# Patient Record
Sex: Female | Born: 1965
Health system: Southern US, Community
[De-identification: ages and names within clinical notes are randomized; demographics above are authoritative.]

## PROBLEM LIST (undated history)

## (undated) DIAGNOSIS — J302 Other seasonal allergic rhinitis: Secondary | ICD-10-CM

## (undated) DIAGNOSIS — D649 Anemia, unspecified: Secondary | ICD-10-CM

## (undated) DIAGNOSIS — A64 Unspecified sexually transmitted disease: Secondary | ICD-10-CM

## (undated) DIAGNOSIS — I10 Essential (primary) hypertension: Secondary | ICD-10-CM

## (undated) HISTORY — DX: Anemia, unspecified: D64.9

## (undated) HISTORY — DX: Unspecified sexually transmitted disease: A64

## (undated) HISTORY — DX: Other seasonal allergic rhinitis: J30.2

## (undated) HISTORY — DX: Essential (primary) hypertension: I10

## (undated) HISTORY — PX: TUBAL LIGATION: SHX77

---

## 2002-03-12 ENCOUNTER — Other Ambulatory Visit: Admission: RE | Admit: 2002-03-12 | Discharge: 2002-03-12 | Payer: Self-pay | Admitting: *Deleted

## 2003-11-30 ENCOUNTER — Other Ambulatory Visit: Admission: RE | Admit: 2003-11-30 | Discharge: 2003-11-30 | Payer: Self-pay | Admitting: Obstetrics and Gynecology

## 2004-07-20 ENCOUNTER — Encounter (INDEPENDENT_AMBULATORY_CARE_PROVIDER_SITE_OTHER): Payer: Self-pay | Admitting: Specialist

## 2004-07-20 ENCOUNTER — Inpatient Hospital Stay (HOSPITAL_COMMUNITY): Admission: AD | Admit: 2004-07-20 | Discharge: 2004-07-23 | Payer: Self-pay | Admitting: Obstetrics & Gynecology

## 2004-08-31 ENCOUNTER — Other Ambulatory Visit: Admission: RE | Admit: 2004-08-31 | Discharge: 2004-08-31 | Payer: Self-pay | Admitting: Gynecology

## 2006-01-29 ENCOUNTER — Other Ambulatory Visit: Admission: RE | Admit: 2006-01-29 | Discharge: 2006-01-29 | Payer: Self-pay | Admitting: Gynecology

## 2008-10-08 ENCOUNTER — Other Ambulatory Visit: Admission: RE | Admit: 2008-10-08 | Discharge: 2008-10-08 | Payer: Self-pay | Admitting: Gynecology

## 2008-10-08 ENCOUNTER — Encounter: Payer: Self-pay | Admitting: Gynecology

## 2008-10-08 ENCOUNTER — Ambulatory Visit: Payer: Self-pay | Admitting: Gynecology

## 2008-10-30 ENCOUNTER — Ambulatory Visit: Payer: Self-pay | Admitting: Gynecology

## 2009-06-03 ENCOUNTER — Ambulatory Visit (HOSPITAL_COMMUNITY): Admission: RE | Admit: 2009-06-03 | Discharge: 2009-06-03 | Payer: Self-pay | Admitting: Gynecology

## 2009-12-28 ENCOUNTER — Other Ambulatory Visit: Admission: RE | Admit: 2009-12-28 | Discharge: 2009-12-28 | Payer: Self-pay | Admitting: Gynecology

## 2009-12-28 ENCOUNTER — Ambulatory Visit: Payer: Self-pay | Admitting: Gynecology

## 2010-10-24 ENCOUNTER — Ambulatory Visit (HOSPITAL_COMMUNITY)
Admission: RE | Admit: 2010-10-24 | Discharge: 2010-10-24 | Payer: Self-pay | Source: Home / Self Care | Attending: Gynecology | Admitting: Gynecology

## 2011-02-24 NOTE — Discharge Summary (Signed)
NAME:  Jessica Henry, Jessica Henry                   ACCOUNT NO.:  [id number]   MEDICAL RECORD NO.:  [id number]          PATIENT TYPE:  INP   LOCATION:  9138                          FACILITY:  [facility]   PHYSICIAN:  Juan H. Lily Peer, M.D.DATE OF BIRTH:  01-05-1966   DATE OF ADMISSION:  07/20/2004  DATE OF DISCHARGE:  07/23/2004                                 DISCHARGE SUMMARY   Total days hospitalized:  3.   HISTORY:  The patient is a 45 year old gravida 3 para 2 at 40-and-a-half  weeks estimated gestational age who was admitted for induction as a result  of postdates.  The patient with pregnancy-induced hypertension.  Eventually  was taken to the operating room for a primary cesarean section secondary to  nonreassuring fetal heart rate tracing, prolonged rupture of membranes.  She  had had meconium-stained amniotic fluid during her induction process and had  received magnesium sulfate for seizure prophylaxis and was taken for a  primary lower uterine segment transverse cesarean section with request for  tubal sterilization, which was performed via the Pomeroy technique.  The  patient's PIH labs on admission had demonstrated a hemoglobin of 11.3;  hematocrit 34.7; platelet count of 241,000.  The remainder of PIH panel was  normal.  Postpartum day #1, hemoglobin and hematocrit 9.8 and 30.4  respectively; platelet count 213,000.  PIH panel once again normal  parameters for pregnancy.  The patient's Foley catheter was removed after 24  hours.  She was up ambulating, voiding well, tolerating clear liquids and  her diet was advanced, and she was ready to be discharged home on  postoperative day #3.  Her blood type was O negative and she had received  RhoGAM on July 21, 2004.  She was afebrile, voiding well, tolerating a  regular diet well, had positive bowel sounds.  Her incision site was intact,  her staples were removed and incision was steri-stripped, she was ready to  be discharged home.   FINAL  DIAGNOSES:  1.  Postdate pregnancy.  2.  Pregnancy-induced hypertension.  3.  Meconium-stained amniotic fluid.  4.  Nonreassuring fetal heart rate tracing.  5.  Prolonged rupture of membranes.  6.  Request for elective permanent sterilization.  7.  RhoGAM candidate.   PROCEDURES PERFORMED:  1.  Primary lower uterine segment transverse cesarean section.  2.  Bilateral tubal sterilization procedure, Pomeroy technique.  3.  RhoGAM administration.   FINAL DISPOSITION AND FOLLOW-UP:  The patient was discharged home on  postoperative day #3, was up ambulating, voiding well, tolerating a regular  diet well.  Her staples were removed, incision was steri-stripped.  She  was given a prescription for prenatal vitamins and iron supplementation to  take one of each daily, along with Tylox one to two tablets p.o. q.4-6h.  p.r.n. pain.  Discharge instructions were provided.  She was instructed to  return back to the office in 6 weeks at [facility] for  routine postpartum visit.     Juan   JHF/MEDQ  D:  08/05/2004  T:  08/05/2004  Job:  [id number]

## 2011-02-24 NOTE — Discharge Summary (Signed)
NAME:  Jessica Henry, Jessica Henry                   ACCOUNT NO.:  [id number]   MEDICAL RECORD NO.:  [id number]          PATIENT TYPE:  INP   LOCATION:  9138                          [facility]ACILITY:  [facility]   PHYSICIAN:  Juan H. Lily Peer, M.D.DATE OF BIRTH:  April 19, 1966   DATE OF ADMISSION:  07/20/2004  DATE OF DISCHARGE:  07/23/2004                                 DISCHARGE SUMMARY   HISTORY:  The patient is a 45 year old, gravida 3, para 2 now para 3 who was  admitted on October 12. The patient was scheduled to be admitted on that day  in the evening for planned induction and subsequently presented with  spontaneous rupture of membranes and labile PIH.  Blood pressure of 143/87,  154/96 on admission.  She had a history of positive group B strep and due to  her allergies, she was placed on clindamycin for prophylaxis.  She had also  had minimal fluid noted so amnioinfusion had been started. Blood pressures  were running in the 80-90 range, her PIH panel was normal, she was started  on mag sulfate for seizure prophylaxis. She eventually had an epidural.  Subsequently she reached 6-7 cm, 90% effaced, -1 station despite adequate  labor pattern.  Her blood pressure was still in the 90-100 range despite  having been on an epidural and lateral positioning.  She had been  approximately 13 hours since her spontaneous rupture of membranes and there  was evidence of decreased long-term beat to beat variability on the fetal  heart rate tracing and subsequently based on this and the nonreassuring  fetal heart rate tracing, she was taken for primary cesarean section and  also she had requested for elective permanent sterilization.  She underwent  a primary lower uterine segment transverse cesarean section and bilateral  tubal sterilization procedure.  On October 12, she delivered a viable female  infant, Apgar's of 7 & 9 with a weight of 7 pounds 13 ounces, arterial cord  pH was 7.19. There was questionable light meconium  so at the time of this C  section she had had DeLee suction of the nasopharyngeal area. She lost  approximately 1000 mL of blood on resection and received 2600 mL of lactated  Ringer's. She was kept on the AICU for 24 hours while she was kept on  magnesium sulfate. The patient's urine output was adequate. Her hemoglobin  and hematocrit postoperative day #1 was 9.8 and 30.4 respectively, platelet  count 213,000.  Her blood pressure systolic is 140, diastolic 80-90. After  24 hours, the magnesium sulfate was discontinued, she was transferred to the  ward, she continued to remain normotensive and asymptomatic. On October 15  __________ postoperative day one, due to the fact that she was O negative.  She had received RhoGAM on October 13, her incision was intact, positive  bowel sounds. __________ and she was ready to be discharged home.   FINAL DIAGNOSES:  1.  Term pregnancy delivered.  2.  Pregnancy induced hypertension.  3.  Request for elective permanent sterilization.   PROCEDURE:  1.  Primary lower uterine  segment transverse cesarean section.  2.  Bilateral tubal sterilization, Pomeroy technique.  3.  RhoGAM administration.   DISPOSITION:  The patient was discharged home on her third postoperative  day. Her staples were removed, her incision was steri-stripped. She was sent  home on prenatal vitamins and iron supplementation.  Her hemoglobin had been  9.8 postpartum.  She was also given a prescription for Tylox to take 1 p.o.  q.4-6 h. p.r.n. pain. Instructed to return to the office in six weeks for  postop visit.      JHF/MEDQ  D:  07/23/2004  T:  07/23/2004  Job:  [id number]

## 2011-02-24 NOTE — H&P (Signed)
NAME:  Jessica Henry, Jessica Henry                   ACCOUNT NO.:  [id number]   MEDICAL RECORD NO.:  [id number]          PATIENT TYPE:  INP   LOCATION:                                [facility]  [facility]   PHYSICIAN:  Timothy P. Fontaine, M.D.DATE OF BIRTH:  1966/04/16   DATE OF ADMISSION:  07/20/2004  DATE OF DISCHARGE:                                HISTORY & PHYSICAL   CHIEF COMPLAINT:  Pregnancy at term, pregnancy induced hypertension, history  of HSV, no recent outbreaks.  Desires permanent sterilization. Positive beta  strep carrier.   HISTORY OF PRESENT ILLNESS:  A 45 year old, G3, P2 female at [redacted] weeks  gestation, history of genital HSV, no recent outbreaks with elevated blood  pressure near term in the 140/90 range.  The patient's admitted for  induction. The patient also desires postpartum tubal sterilization.  For the  remainder of her history, see her [facility].   PHYSICAL EXAMINATION:  HEENT:  Normal.  LUNGS:  Clear.  CARDIAC:  Regular rate with no rubs, murmurs or gallops.  ABDOMEN:  Gravid vertex fetus, reactive NST.  PELVIC:  Cervix is closed, 50%, -2 station, no evidence of HSV outbreak.   ASSESSMENT:  A 45 year old, G3, P2 female at term. Pregnancy induced  hypertension in 140/90 range, history of HSV, no recent outbreaks for  induction with Cervidil in the p.m., Pitocin in the a.m. Plan discussed and  accepted by the patient. She is beta strep positive and will plan on  antibiotic prophylaxis.      TPF/MEDQ  D:  07/19/2004  T:  07/19/2004  Job:  [id number]

## 2011-02-24 NOTE — Op Note (Signed)
NAME:  Jessica Henry, Jessica Henry                   ACCOUNT NO.:  [id number]   MEDICAL RECORD NO.:  [id number]          PATIENT TYPE:  INP   LOCATION:  9162                          [facility]   PHYSICIAN:  Juan H. Lily Peer, M.D.DATE OF BIRTH:  1966-09-19   DATE OF PROCEDURE:  07/20/2004  DATE OF DISCHARGE:                                 OPERATIVE REPORT   PREOPERATIVE DIAGNOSES:  1.  Post-dates pregnancy.  2.  Pregnancy-induced hypertension.  3.  Nonreassuring fetal heart rate tracing.  4.  Prolonged rupture of membranes.  5.  Request for elective permanent sterilization.   POSTOPERATIVE DIAGNOSES:  1.  Post-dates pregnancy.  2.  Pregnancy-induced hypertension.  3.  Nonreassuring fetal heart rate tracing.  4.  Prolonged rupture of membranes.  5.  Request for elective permanent sterilization.   ANESTHESIA:  Epidural.   SURGEON:  Juan H. Lily Peer, M.D.   PROCEDURES PERFORMED:  1.  Primary lower uterine segment transverse cesarean section.  2.  Bilateral tubal sterilization procedure, Pomeroy technique.   FINDINGS:  A viable female infant, Apgars of 7 and 9, with a weight of 7  pounds 13 ounces, arterial cord pH of 7.19.   INDICATION FOR OPERATION:  A 45 year old gravida 3, para 2, at 40-1/2 weeks'  estimated gestational age with prolonged rupture of membranes, nonreassuring  fetal heart rate tracing, pregnancy-induced hypertension, request for  elective permanent sterilization.   DESCRIPTION OF OPERATION:  After the patient was adequately counseled, she  was taken to the operating room, where she was redosed on her epidural  catheter.  Fetal heart rate was appreciated before disconnecting the fetal  scalp electrode.  It was in the 140-150 range.  The abdomen was prepped and  draped in the usual sterile fashion.  A Foley catheter was already in place.  A Pfannenstiel skin incision was made 2 cm above the symphysis pubis.  The  incision was carried from the skin and subcutaneous  tissue down to the  rectus fascia, whereby a midline nick was made.  The fascia was incised in a  transverse fashion.  The midline raphe was entered.  The peritoneal cavity  was entered cautiously.  The bladder flap was established, and the lower  uterine segment was incised in a transverse fashion.  Clear amniotic fluid  was present.  The newborn's head was delivered.  With the use of the DeLee  suction, the nasopharyngeal area was suctioned with clear fluid.  The  newborn gave an immediate cry after delivery.  The cord was doubly clamped  and excised and passed off to the pediatricians who were in attendance, who  gave the above-mentioned parameters.  After cord blood was obtained, the  placenta was delivered from the intrauterine cavity.  The uterus was  exteriorized.  The intrauterine cavity was swept clear of remaining products  of conception.  The transverse lower uterine incision was closed with a  running locking stitch of 0 Vicryl suture.  The uterus was placed back into  the pelvic cavity and then the left proximal portion of the fallopian tube  was identified and placed under traction with a Babcock clamp, and a 2 cm  segment was suture ligated x2 with 3-0 Vicryl suture and a 2 cm stump was  excised and passed off the operative field for histological evaluation.  The  remaining segments were Bovie cauterized.  A similar procedure was carried  out on the contralateral side.  The pelvic cavity was once again irrigated  with normal saline solution.  After ascertaining adequate hemostasis,  closure was started.  The visceral peritoneum was not reapproximated, but  the rectus fascia was closed with a running stitch of 0 Vicryl suture, the  subcutaneous bleeders were Bovie cauterized, the skin was reapproximated  with skin clips, followed by placement of Xeroform gauze and 4 x 8 dressing.  The patient was transferred to the recovery room with stable vital signs.  Blood loss was 1000  mL.  IV fluid was 2600 mL of lactated Ringer's.  Urine  output was 325 mL.  The patient had received Clindamycin at approximately  1600 hours as a result of a positive GBS culture and will receive an  additional dose at 2200 hours.      JHF/MEDQ  D:  07/20/2004  T:  07/21/2004  Job:  [id number]

## 2011-10-24 ENCOUNTER — Encounter: Payer: Self-pay | Admitting: *Deleted

## 2011-10-24 NOTE — Progress Notes (Signed)
Patient ID: Jessica Henry, female   DOB: 1966-06-27, 46 y.o.   MRN: [id number] Pt called and left message wanting refill on valtrex, pt is overdue for annual since march 2011. Left message for pt to call.

## 2011-11-20 ENCOUNTER — Encounter: Payer: Self-pay | Admitting: Gynecology

## 2011-11-27 ENCOUNTER — Other Ambulatory Visit (HOSPITAL_COMMUNITY)
Admission: RE | Admit: 2011-11-27 | Discharge: 2011-11-27 | Disposition: A | Discharge status: Home / Self Care | Payer: BC Managed Care – PPO | Source: Ambulatory Visit | Attending: Gynecology | Admitting: Gynecology

## 2011-11-27 ENCOUNTER — Ambulatory Visit (INDEPENDENT_AMBULATORY_CARE_PROVIDER_SITE_OTHER): Payer: BC Managed Care – PPO | Admitting: Gynecology

## 2011-11-27 ENCOUNTER — Encounter: Payer: Self-pay | Admitting: Gynecology

## 2011-11-27 VITALS — BP 136/88 | Ht 63.25 in | Wt 213.0 lb

## 2011-11-27 DIAGNOSIS — R221 Localized swelling, mass and lump, neck: Secondary | ICD-10-CM

## 2011-11-27 DIAGNOSIS — R8290 Unspecified abnormal findings in urine: Secondary | ICD-10-CM

## 2011-11-27 DIAGNOSIS — Z01419 Encounter for gynecological examination (general) (routine) without abnormal findings: Secondary | ICD-10-CM

## 2011-11-27 DIAGNOSIS — Z13220 Encounter for screening for lipoid disorders: Secondary | ICD-10-CM

## 2011-11-27 DIAGNOSIS — R8281 Pyuria: Secondary | ICD-10-CM

## 2011-11-27 DIAGNOSIS — R82998 Other abnormal findings in urine: Secondary | ICD-10-CM

## 2011-11-27 DIAGNOSIS — Z131 Encounter for screening for diabetes mellitus: Secondary | ICD-10-CM

## 2011-11-27 DIAGNOSIS — J302 Other seasonal allergic rhinitis: Secondary | ICD-10-CM | POA: Insufficient documentation

## 2011-11-27 DIAGNOSIS — R220 Localized swelling, mass and lump, head: Secondary | ICD-10-CM

## 2011-11-27 LAB — CBC WITH DIFFERENTIAL/PLATELET
Eosinophils Relative: 3 % (ref 0–5)
HCT: 33.8 % — ABNORMAL LOW (ref 36.0–46.0)
Hemoglobin: 10.9 g/dL — ABNORMAL LOW (ref 12.0–15.0)
Lymphocytes Relative: 27 % (ref 12–46)
Lymphs Abs: 2.5 10*3/uL (ref 0.7–4.0)
MCV: 79.7 fL (ref 78.0–100.0)
Monocytes Absolute: 0.6 10*3/uL (ref 0.1–1.0)
Monocytes Relative: 6 % (ref 3–12)
Platelets: 334 10*3/uL (ref 150–400)
RBC: 4.24 MIL/uL (ref 3.87–5.11)
WBC: 9.3 10*3/uL (ref 4.0–10.5)

## 2011-11-27 LAB — URINALYSIS W MICROSCOPIC + REFLEX CULTURE
Casts: NONE SEEN
Glucose, UA: NEGATIVE mg/dL
Ketones, ur: NEGATIVE mg/dL
Nitrite: NEGATIVE
WBC, UA: NONE SEEN WBC/hpf (ref <3)
pH: 6.5 (ref 5.0–8.0)

## 2011-11-27 LAB — LIPID PANEL
HDL: 44 mg/dL (ref >39)
LDL Cholesterol: 89 mg/dL (ref 0–99)
Total CHOL/HDL Ratio: 3.4 Ratio
Triglycerides: 81 mg/dL (ref <150)

## 2011-11-27 MED ORDER — VALACYCLOVIR HCL 500 MG PO TABS: 500.0000 mg | ORAL_TABLET | Freq: Every day | ORAL | Status: DC

## 2011-11-27 NOTE — Progress Notes (Signed)
Jessica Henry 03-26-1966 [id number]        46 y.o.  for annual exam.  Doing well without complaints  Past medical history,surgical history, medications, allergies, family history and social history were all reviewed and documented in the EPIC chart. ROS:  Was performed and pertinent positives and negatives are included in the history.  Exam: Jessica Henry chaperone present Filed Vitals:   11/27/11 0920  BP: 136/88   General appearance  Normal Skin grossly normal Head/Neck with 3-4 cm left lipomatous mass lateral base of neck and juncture with shoulder, nontender mobile.  No cervical or supraclavicular adenopathy thyroid normal Lungs  clear Cardiac RR, without RMG Abdominal  soft, nontender, without masses, organomegaly or hernia Breasts  examined lying and sitting without masses, retractions, discharge or axillary adenopathy. Pelvic  Ext/BUS/vagina  normal   Cervix  normal  Pap done  Uterus  anteverted, normal size, shape and contour, midline and mobile nontender   Adnexa  Without masses or tenderness    Anus and perineum  normal   Rectovaginal  normal sphincter tone without palpated masses or tenderness.    Assessment/Plan:  46 y.o. female for annual exam.    1. Lipomatous feeling mass left neck. Patient notes been there for several weeks never noticed it before. Going to start with a study CT/MRI and then referred to surgery for evaluation. 2. Pap smear. Pap smear was done today. She has no history of abnormal Pap smears with numerous normal records in her chart. Assuming this Pap smear normal will plan every 3 year Pap smears per current screening guidelines. 3. Mammography. Patient due for mammogram now and I encouraged her to schedule this. SBE monthly reviewed. 4. Health maintenance.  Menses are regular and acceptable. Status post BTL. Will check baseline CBC glucose lipid profile and urinalysis. Assuming patient continues well from a gynecologic standpoint she'll follow up in a year,  sooner as needed. Patient does know the importance of follow up with the general surgeon as we arrange for her.    Jessica Lords MD, 10:22 AM 11/27/2011

## 2011-11-27 NOTE — Patient Instructions (Signed)
Office will contact you about scheduling the x-ray study for the neck mass and follow up appointment with a surgeon.

## 2011-11-28 LAB — URINE CULTURE: Colony Count: 5000

## 2011-11-28 NOTE — Progress Notes (Signed)
Addended by: Dara Lords on: 11/28/2011 12:52 PM   Modules accepted: Orders

## 2011-11-28 NOTE — Progress Notes (Signed)
Addended by: Venora Maples on: 11/28/2011 04:13 PM   Modules accepted: Orders

## 2011-11-30 ENCOUNTER — Telehealth: Payer: Self-pay | Admitting: *Deleted

## 2011-11-30 ENCOUNTER — Other Ambulatory Visit: Payer: Self-pay | Admitting: *Deleted

## 2011-11-30 DIAGNOSIS — R221 Localized swelling, mass and lump, neck: Secondary | ICD-10-CM

## 2011-11-30 NOTE — Telephone Encounter (Signed)
Lm for patient to call.  appt set for mri at [facility] on 12/02/11 @ 10am.

## 2011-11-30 NOTE — Telephone Encounter (Signed)
Message copied by Mckinley Jewel Gisele Pack L on Thu Nov 30, 2011 11:10 AM ------      Message from: Dara Lords      Created: Mon Nov 27, 2011 11:20 AM       Schedule neck/thorax MRI reference new onset lipomatous mass base of left neck I'm going to refer her to the general surgeons but I want to see the MRI report first.

## 2011-12-01 NOTE — Telephone Encounter (Signed)
Patient informed. 

## 2011-12-02 ENCOUNTER — Encounter (HOSPITAL_COMMUNITY): Payer: Self-pay | Admitting: Emergency Medicine

## 2011-12-02 ENCOUNTER — Emergency Department (HOSPITAL_COMMUNITY)
Admission: EM | Admit: 2011-12-02 | Discharge: 2011-12-02 | Disposition: A | Discharge status: Home / Self Care | Payer: BC Managed Care – PPO | Attending: Emergency Medicine | Admitting: Emergency Medicine

## 2011-12-02 ENCOUNTER — Ambulatory Visit (HOSPITAL_COMMUNITY)
Admission: RE | Admit: 2011-12-02 | Discharge: 2011-12-02 | Disposition: A | Discharge status: Home / Self Care | Payer: BC Managed Care – PPO | Source: Ambulatory Visit | Attending: Gynecology | Admitting: Gynecology

## 2011-12-02 DIAGNOSIS — R221 Localized swelling, mass and lump, neck: Secondary | ICD-10-CM

## 2011-12-02 DIAGNOSIS — I10 Essential (primary) hypertension: Secondary | ICD-10-CM | POA: Insufficient documentation

## 2011-12-02 DIAGNOSIS — J309 Allergic rhinitis, unspecified: Secondary | ICD-10-CM | POA: Insufficient documentation

## 2011-12-02 DIAGNOSIS — R220 Localized swelling, mass and lump, head: Secondary | ICD-10-CM | POA: Insufficient documentation

## 2011-12-02 DIAGNOSIS — E079 Disorder of thyroid, unspecified: Secondary | ICD-10-CM | POA: Insufficient documentation

## 2011-12-02 MED ORDER — MOMETASONE FUROATE 50 MCG/ACT NA SUSP: 2.0000 | Freq: Every day | NASAL | Status: DC

## 2011-12-02 MED ORDER — GADOBENATE DIMEGLUMINE 529 MG/ML IV SOLN
20.0000 mL | Freq: Once | INTRAVENOUS | Status: AC | PRN
  Administered 2011-12-02: 20 mL via INTRAVENOUS

## 2011-12-02 MED ORDER — DIPHENHYDRAMINE HCL 25 MG PO CAPS: 25.0000 mg | ORAL_CAPSULE | Freq: Four times a day (QID) | ORAL | Status: AC | PRN

## 2011-12-02 NOTE — ED Provider Notes (Signed)
History     CSN: [id number]  Arrival date & time 12/02/11  1254   First MD Initiated Contact with Patient 12/02/11 1323      Chief Complaint  Patient presents with  . Facial Swelling    (Consider location/radiation/quality/duration/timing/severity/associated sxs/prior treatment) Patient is a 46 y.o. female presenting with general illness. The history is provided by the patient. No language interpreter was used.  Illness  The current episode started today (Pt had been having an MRI to check on subcutaneous masses in her shoulders.  When she was almost finished she sneezed, and had profuse rhinorrhea.  When she was dressing sho noted mild swelling just below the right lower eyelid.  these was no itching.). The onset was sudden. The problem has been unchanged. The problem is mild. The symptoms are relieved by nothing. The symptoms are aggravated by nothing. Associated symptoms include rhinorrhea. Pertinent negatives include no fever, no cough, no wheezing and no rash.    Past Medical History  Diagnosis Date  . Seasonal allergies   . Hypertension     Past Surgical History  Procedure Date  . Cesarean section   . Tubal ligation     Family History  Problem Relation Age of Onset  . Diabetes Mother   . Hypertension Mother   . Heart disease Mother     History  Substance Use Topics  . Smoking status: Never Smoker   . Smokeless tobacco: Never Used  . Alcohol Use: No    OB History    Grav Para Term Preterm Abortions TAB SAB Ect Mult Living   3 3 2 1      3       Review of Systems  Constitutional: Negative.  Negative for fever and chills.  HENT: Positive for facial swelling and rhinorrhea.   Eyes: Negative.   Respiratory: Negative.  Negative for cough and wheezing.   Cardiovascular: Negative.   Gastrointestinal: Negative.   Genitourinary: Negative.   Musculoskeletal: Negative.   Skin: Negative for rash.  Neurological: Negative.   Psychiatric/Behavioral: Negative.      Allergies  Penicillins  Home Medications   Current Outpatient Rx  Name Route Sig Dispense Refill  . OMEGA-3 FATTY ACIDS 1000 MG PO CAPS Oral Take 1 g by mouth every morning.     Marland Kitchen LISINOPRIL 10 MG PO TABS Oral Take 10 mg by mouth every morning.     . MOMETASONE FUROATE 50 MCG/ACT NA SUSP Nasal Place 2 sprays into the nose every morning.     Marland Kitchen ONE-DAILY MULTI VITAMINS PO TABS Oral Take 1 tablet by mouth every morning.     Marland Kitchen NAPHAZOLINE-PHENIRAMINE 0.025-0.3 % OP SOLN Both Eyes Place 1 drop into both eyes 4 (four) times daily as needed. For eye irritation.    Marland Kitchen VALACYCLOVIR HCL 500 MG PO TABS Oral Take 500 mg by mouth daily as needed. For outbreaks.      BP 151/87  Pulse 86  Temp(Src) 98.8 F (37.1 C) (Oral)  Resp 18  Ht 5\' 4"  (1.626 m)  Wt 200 lb (90.719 kg)  BMI 34.33 kg/m2  SpO2 100%  LMP 11/09/2011  Physical Exam  Nursing note and vitals reviewed. Constitutional: She is oriented to person, place, and time. She appears well-developed and well-nourished. No distress.  HENT:  Head: Normocephalic and atraumatic.  Right Ear: External ear normal.  Left Ear: External ear normal.  Mouth/Throat: Oropharynx is clear and moist.       She has swelling of the nasal  mucosa and turbinates, with a clear rhinorrhea.  She has mild swelling below the right lower eyelid, but no redness or subcutaneous emphysema.    Eyes: Conjunctivae and EOM are normal. Pupils are equal, round, and reactive to light.  Cardiovascular: Normal rate, regular rhythm and normal heart sounds.   Pulmonary/Chest: Effort normal and breath sounds normal.  Abdominal: Soft. There is no tenderness.  Musculoskeletal: Normal range of motion.  Neurological: She is alert and oriented to person, place, and time.       No sensory or motor deficit.  Skin: Skin is warm and dry.  Psychiatric: She has a normal mood and affect. Her behavior is normal.    ED Course  Procedures (including critical care time)  1:53 PM Pt  seen --> physical exam performed.  She has swelling of her nasal mucosa from allergic rhinitis, and has mild puffiness just below her right lower eyelid, but no subcutaneous emphysema.  I refilled her prescription for Nasonex nasal inhaler, and advised her to use Benadryl 25 mg every 4 hours if she developed itching.     1. Facial swelling   2. Allergic rhinitis            Carleene Cooper III, MD 12/02/11 [phone]

## 2011-12-02 NOTE — Discharge Instructions (Signed)
Ms. Jessica Henry, you had mild swelling below the right lower eyelid after you had an MRI today and had sneezing and clear discharge from your nose. Your exam shows swelling of the nasal mucosa and clear mucus.  There is mild swelling below the right lower eyelid.  Dr. Ignacia Palma refilled your prescription for the Nasonex inhaler.  He advised you to rest with the head elevated and to try not to sneeze, hoping to let the swelling go down.  Take Benadryl 25 mg every 4 hours if you develop itching.  Return for recheck if your swelling worsens or if you develop a fever or if you have other worrisome symptoms.

## 2011-12-02 NOTE — ED Notes (Signed)
Pt was at outpatient MRI and at end of procedure pt felt a "tickle in my throat" with right facial swelling.

## 2011-12-06 ENCOUNTER — Telehealth: Payer: Self-pay | Admitting: *Deleted

## 2011-12-06 NOTE — Telephone Encounter (Signed)
Called pt and her mailbox is full, will try to call pt back later.

## 2011-12-06 NOTE — Telephone Encounter (Signed)
Message copied by Aura Camps on Wed Dec 06, 2011 11:47 AM ------      Message from: Dara Lords      Created: Wed Dec 06, 2011 11:44 AM       Get patient on the phone for me please

## 2011-12-08 ENCOUNTER — Telehealth: Payer: Self-pay | Admitting: *Deleted

## 2011-12-08 NOTE — Telephone Encounter (Signed)
Patient informed appt set up with Dr. Talmage Nap on 01/24/12 @1 :30pm

## 2011-12-15 NOTE — Telephone Encounter (Signed)
Pt mailbox is full

## 2013-07-02 ENCOUNTER — Telehealth: Payer: Self-pay | Admitting: *Deleted

## 2013-07-02 MED ORDER — VALACYCLOVIR HCL 500 MG PO TABS: 500.0000 mg | ORAL_TABLET | Freq: Every day | ORAL | Status: DC | PRN

## 2013-07-02 NOTE — Telephone Encounter (Signed)
Pt called requesting refill on valtrex 500 mg, pt has annual scheduled of Oct 24. rx sent.

## 2013-07-28 ENCOUNTER — Encounter: Payer: Self-pay | Admitting: Gynecology

## 2013-08-01 ENCOUNTER — Ambulatory Visit (INDEPENDENT_AMBULATORY_CARE_PROVIDER_SITE_OTHER): Payer: BC Managed Care – PPO | Admitting: Gynecology

## 2013-08-01 ENCOUNTER — Encounter: Payer: Self-pay | Admitting: Gynecology

## 2013-08-01 ENCOUNTER — Telehealth: Payer: Self-pay | Admitting: *Deleted

## 2013-08-01 VITALS — BP 124/80 | Ht 63.0 in | Wt 215.0 lb

## 2013-08-01 DIAGNOSIS — B009 Herpesviral infection, unspecified: Secondary | ICD-10-CM

## 2013-08-01 DIAGNOSIS — E041 Nontoxic single thyroid nodule: Secondary | ICD-10-CM

## 2013-08-01 DIAGNOSIS — N920 Excessive and frequent menstruation with regular cycle: Secondary | ICD-10-CM

## 2013-08-01 DIAGNOSIS — A609 Anogenital herpesviral infection, unspecified: Secondary | ICD-10-CM

## 2013-08-01 DIAGNOSIS — Z01419 Encounter for gynecological examination (general) (routine) without abnormal findings: Secondary | ICD-10-CM

## 2013-08-01 LAB — CBC WITH DIFFERENTIAL/PLATELET
Basophils Relative: 0 % (ref 0–1)
Eosinophils Absolute: 0.2 10*3/uL (ref 0.0–0.7)
HCT: 32.2 % — ABNORMAL LOW (ref 36.0–46.0)
Hemoglobin: 10.6 g/dL — ABNORMAL LOW (ref 12.0–15.0)
MCH: 25.6 pg — ABNORMAL LOW (ref 26.0–34.0)
MCHC: 32.9 g/dL (ref 30.0–36.0)
Monocytes Absolute: 0.8 10*3/uL (ref 0.1–1.0)
Monocytes Relative: 8 % (ref 3–12)

## 2013-08-01 LAB — COMPREHENSIVE METABOLIC PANEL
Albumin: 4.3 g/dL (ref 3.5–5.2)
Alkaline Phosphatase: 52 U/L (ref 39–117)
BUN: 12 mg/dL (ref 6–23)
Glucose, Bld: 97 mg/dL (ref 70–99)
Total Bilirubin: 0.3 mg/dL (ref 0.3–1.2)

## 2013-08-01 MED ORDER — VALACYCLOVIR HCL 500 MG PO TABS: 500.0000 mg | ORAL_TABLET | Freq: Every day | ORAL | Status: DC | PRN

## 2013-08-01 NOTE — Telephone Encounter (Signed)
Notes will be faxed to Dr.Balan they will contact me with time and date. I  Will fax once tsh level result is back.

## 2013-08-01 NOTE — Progress Notes (Signed)
Jessica Henry [birth year] [id number]        47 y.o.  [id number] for annual exam.  Several issues below.  Past medical history,surgical history, medications, allergies, family history and social history were all reviewed and documented in the EPIC chart.  ROS:  Performed and pertinent positives and negatives are included in the history, assessment and plan .  Exam: Kim assistant Filed Vitals:   08/01/13 1034  BP: 124/80  Height: 5\' 3"  (1.6 m)  Weight: 215 lb (97.523 kg)   General appearance  Normal Skin grossly normal Head/Neck With no cervical or supraclavicular adenopathy thyroid normal. Prominent left supra-clavicular fat pad Lungs  clear Cardiac RR, without RMG Abdominal  soft, nontender, without masses, organomegaly or hernia Breasts  examined lying and sitting without masses, retractions, discharge or axillary adenopathy. Pelvic  Ext/BUS/vagina  normal  Cervix  normal  Uterus  anteverted generous in size midline mobile nontender.  Adnexa  Without masses or tenderness    Anus and perineum  normal   Rectovaginal  normal sphincter tone without palpated masses or tenderness.    Assessment/Plan:  47 y.o. [id number] female for annual exam, heavy menses, tubal sterilization.   1. Menorrhagia. Patient's periods have become heavier such now that she wears double protection and has bleedthrough episodes. They are regular monthly. She is status post tubal sterilization. Exam shows uterus upper limits of normal size. Start with CBC TSH and sonohysterogram. Patient had questions about endometrial ablation and we will further discuss pending the above results.  2. Left thyroid lobe lesion. 9 x 13 x 17 mm well circumscribed on MRI last year done because of the prominent supraclavicular fat pad which did confirm that it was just a fat pad. Had appointment for further evaluation with Dr. Talmage Nap but she missed her appointment and never followed up. We will go ahead and make another appointment for her I  emphasized the need to followup for this to rule out tumor. 3. Genital HSV. Uses Valtrex daily. Has been doing this for years. Discussed options to stop and see what her recurrence rate is. Patient wants to go ahead and try this. If she does have a recurrence she'll start Valtrex 500 mg twice a day for 3-5 days. If she seems to have more frequent outbreaks she will reinitiate the daily suppressive therapy. I refilled her Valtrex for her. 4. Mammography 2012. Patient knows she's overdue and agrees to schedule. SBE monthly reviewed. 5. Pap smear 2013. No Pap smear done today. No history of abnormal Pap smears previously. Plan repeat Pap smear 3 year interval. 6. Health maintenance. Baseline CBC comprehensive metabolic panel urinalysis TSH ordered. Lipid profile normal last year. Followup for sonohysterogram and appointment with Dr. Talmage Nap.   Note: This document was prepared with digital dictation and possible smart phrase technology. Any transcriptional errors that result from this process are unintentional.   Dara Lords MD, 11:29 AM 08/01/2013

## 2013-08-01 NOTE — Telephone Encounter (Signed)
Message copied by Aura Camps on Fri Aug 01, 2013  4:12 PM ------      Message from: Dara Lords      Created: Fri Aug 01, 2013 11:33 AM       Patient needs appointment to see Dr Talmage Nap. She has a left thyroid nodule that needs evaluated. Was supposed to see her last year but missed her appointment. Information in the MRI report ------

## 2013-08-01 NOTE — Patient Instructions (Addendum)
Followup for ultrasound as scheduled.  Followup with Dr. Talmage Nap or her associate for further evaluation of your thyroid nodule.  Call to Schedule your mammogram  Facilities in [place]: 1)  [facility] of [place], [place] Phone: [phone] 2)  [facility], [address] [id number] Phone: [phone] 3)  Dr. Yolanda Bonine at [facility] [address] Phone: [phone]     Mammogram A mammogram is an X-ray test to find changes in a woman's breast. You should get a mammogram if:  You are 64 years of age or older  You have risk factors.   Your doctor recommends that you have one.  BEFORE THE TEST  Do not schedule the test the week before your period, especially if your breasts are sore during this time.  On the day of your mammogram:  Wash your breasts and armpits well. After washing, do not put on any deodorant or talcum powder on until after your test.   Eat and drink as you usually do.   Take your medicines as usual.   If you are diabetic and take insulin, make sure you:   Eat before coming for your test.   Take your insulin as usual.   If you cannot keep your appointment, call before the appointment to cancel. Schedule another appointment.  TEST  You will need to undress from the waist up. You will put on a hospital gown.   Your breast will be put on the mammogram machine, and it will press firmly on your breast with a piece of plastic called a compression paddle. This will make your breast flatter so that the machine can X-ray all parts of your breast.   Both breasts will be X-rayed. Each breast will be X-rayed from above and from the side. An X-ray might need to be taken again if the picture is not good enough.   The mammogram will last about 15 to 30 minutes.  AFTER THE TEST Finding out the results of your test Ask when your test results will be ready. Make sure you get  your test results.  Document Released: 12/22/2008 Document Revised: 09/14/2011 Document Reviewed: 12/22/2008 [facility] Patient Information 2012 [place], [place].

## 2013-08-01 NOTE — Telephone Encounter (Deleted)
Message copied by Aura Camps on Fri Aug 01, 2013  3:50 PM ------      Message from: Jessica Henry      Created: Fri Aug 01, 2013 11:33 AM       Patient needs appointment to see Dr Talmage Nap. She has a left thyroid nodule that needs evaluated. Was supposed to see her last year but missed her appointment. Information in the MRI report ------

## 2013-08-02 LAB — URINALYSIS W MICROSCOPIC + REFLEX CULTURE
Bilirubin Urine: NEGATIVE
Crystals: NONE SEEN
Glucose, UA: NEGATIVE mg/dL
Leukocytes, UA: NEGATIVE
Specific Gravity, Urine: 1.029 (ref 1.005–1.030)
Urobilinogen, UA: 0.2 mg/dL (ref 0.0–1.0)

## 2013-08-04 ENCOUNTER — Encounter: Payer: Self-pay | Admitting: Gynecology

## 2013-08-04 LAB — URINE CULTURE

## 2013-08-04 NOTE — Telephone Encounter (Signed)
Notes faxed to Dr.Balan office, they will contact me with time and date. 

## 2013-08-06 ENCOUNTER — Other Ambulatory Visit: Payer: Self-pay | Admitting: Gynecology

## 2013-08-06 DIAGNOSIS — N852 Hypertrophy of uterus: Secondary | ICD-10-CM

## 2013-08-06 DIAGNOSIS — N920 Excessive and frequent menstruation with regular cycle: Secondary | ICD-10-CM

## 2013-08-06 NOTE — Telephone Encounter (Signed)
appt 09/08/13 @ 10:30 am, pt can call to see if sooner appointment.

## 2013-08-06 NOTE — Telephone Encounter (Signed)
Left message for to call. 

## 2013-08-11 ENCOUNTER — Ambulatory Visit (INDEPENDENT_AMBULATORY_CARE_PROVIDER_SITE_OTHER): Payer: BC Managed Care – PPO

## 2013-08-11 ENCOUNTER — Other Ambulatory Visit: Payer: Self-pay | Admitting: Gynecology

## 2013-08-11 ENCOUNTER — Encounter: Payer: Self-pay | Admitting: Gynecology

## 2013-08-11 ENCOUNTER — Ambulatory Visit (INDEPENDENT_AMBULATORY_CARE_PROVIDER_SITE_OTHER): Payer: BC Managed Care – PPO | Admitting: Gynecology

## 2013-08-11 DIAGNOSIS — N920 Excessive and frequent menstruation with regular cycle: Secondary | ICD-10-CM

## 2013-08-11 DIAGNOSIS — N390 Urinary tract infection, site not specified: Secondary | ICD-10-CM

## 2013-08-11 DIAGNOSIS — D251 Intramural leiomyoma of uterus: Secondary | ICD-10-CM

## 2013-08-11 DIAGNOSIS — D259 Leiomyoma of uterus, unspecified: Secondary | ICD-10-CM

## 2013-08-11 DIAGNOSIS — N8300 Follicular cyst of ovary, unspecified side: Secondary | ICD-10-CM

## 2013-08-11 DIAGNOSIS — N852 Hypertrophy of uterus: Secondary | ICD-10-CM

## 2013-08-11 DIAGNOSIS — N831 Corpus luteum cyst of ovary, unspecified side: Secondary | ICD-10-CM

## 2013-08-11 DIAGNOSIS — N72 Inflammatory disease of cervix uteri: Secondary | ICD-10-CM

## 2013-08-11 DIAGNOSIS — N888 Other specified noninflammatory disorders of cervix uteri: Secondary | ICD-10-CM

## 2013-08-11 MED ORDER — SULFAMETHOXAZOLE-TRIMETHOPRIM 800-160 MG PO TABS: 1.0000 | ORAL_TABLET | Freq: Two times a day (BID) | ORAL | Status: DC

## 2013-08-11 NOTE — Progress Notes (Signed)
Patient presents for sonohysterogram due to menorrhagia.  Ultrasound shows uterus enlarged approaching 19 weeks. Endometrial echo 9.2 mm. Large 10 cm posterior myoma. Right and left ovaries with physiologic changes.  Reexamination shows uterus generous approaching 18 weeks size. Sonohysterogram performed, sterile technique, easy catheter introduction, good distention with no abnormalities. Endometrial sample taken.  Assessment and plan: Menorrhagia with large myoma. Under estimated size of uterus with prior exam but clearly she has an enlarged uterus at 18. Options for management reviewed with the patient to include hormonal manipulation, IUD, endometrial ablation, myomectomy either laparoscopic or open, uterine artery embolization and hysterectomy. Given the size of the uterus and circumstances I think hysterectomy would be her best choice. Options of laparoscopic/robotic versus TAH reviewed. Again given her short her status I think from a technical standpoint a TAH would be more reasonable. Referral to consider uterine artery embolization also discussed but rejected. The ovarian conservation issue also reviewed with keeping her ovaries for continued hormone production versus ovarian disease to include ovarian cancer in the future. Removing her ovaries and the potential for ERT and associated risks also discussed. Hemoglobin 10. She is on extra iron. Discussed possible Depo-Lupron preoperatively to allow for hemoglobin recovery and decreased risk of transfusion. Patient wants to think about her options and said she will call with her decision. She will followup for her pathology results from the endometrial sample also.  Patient had been sent a letter as herphone did not allow for messaging, about a UTI discovered at her recent annual exam and I prescribed Septra DS 1 by mouth twice a day x3 days today.

## 2013-08-11 NOTE — Patient Instructions (Signed)
Office will call you with biopsy results. You will think about the options we discussed and followup with your decision.

## 2013-09-01 ENCOUNTER — Encounter: Payer: Self-pay | Admitting: *Deleted

## 2013-09-01 NOTE — Telephone Encounter (Signed)
Letter mailed to patient regarding the below appointment.

## 2013-09-12 ENCOUNTER — Other Ambulatory Visit: Payer: Self-pay | Admitting: Gynecology

## 2013-09-12 MED ORDER — SULFAMETHOXAZOLE-TMP DS 800-160 MG PO TABS: 1.0000 | ORAL_TABLET | Freq: Two times a day (BID) | ORAL | Status: DC

## 2013-09-22 ENCOUNTER — Telehealth: Payer: Self-pay | Admitting: *Deleted

## 2013-09-22 NOTE — Telephone Encounter (Signed)
Pt missed 2 scheduled appointments with Dr.Balan 1. On 03/12/13 @ 1:30 pm and second one on 09/08/13 @ 10:30 am. Pt asked if she could be rescheduled with Dr. Talmage Nap I called office and was told Dr.Balan will not see patient that have missed 2 appointment which both were no show. I informed pt with this as well and asked pt would she like me to make another referral pt declined and said she will ask some coworker for some names and call back if needed.

## 2013-09-22 NOTE — Telephone Encounter (Signed)
Message copied by Aura Camps on Mon Sep 22, 2013 11:02 AM ------      Message from: Keenan Bachelor      Created: Fri Sep 12, 2013  4:01 PM      Regarding: Reschedule Dr. Talmage Nap appt       Missed appt on Monday with Dr. Talmage Nap.  You can call her at work number. ------

## 2013-10-15 ENCOUNTER — Other Ambulatory Visit: Payer: Self-pay | Admitting: Gynecology

## 2013-10-15 DIAGNOSIS — Z1231 Encounter for screening mammogram for malignant neoplasm of breast: Secondary | ICD-10-CM

## 2013-10-21 ENCOUNTER — Ambulatory Visit (HOSPITAL_COMMUNITY)
Admission: RE | Admit: 2013-10-21 | Discharge: 2013-10-21 | Disposition: A | Discharge status: Home / Self Care | Payer: BC Managed Care – PPO | Source: Ambulatory Visit | Attending: Gynecology | Admitting: Gynecology

## 2013-10-21 DIAGNOSIS — Z1231 Encounter for screening mammogram for malignant neoplasm of breast: Secondary | ICD-10-CM | POA: Insufficient documentation

## 2013-10-23 ENCOUNTER — Other Ambulatory Visit: Payer: Self-pay | Admitting: Gynecology

## 2013-10-23 DIAGNOSIS — R928 Other abnormal and inconclusive findings on diagnostic imaging of breast: Secondary | ICD-10-CM

## 2013-11-03 ENCOUNTER — Ambulatory Visit
Admission: RE | Admit: 2013-11-03 | Discharge: 2013-11-03 | Disposition: A | Discharge status: Home / Self Care | Payer: BC Managed Care – PPO | Source: Ambulatory Visit | Attending: Gynecology | Admitting: Gynecology

## 2013-11-03 DIAGNOSIS — R928 Other abnormal and inconclusive findings on diagnostic imaging of breast: Secondary | ICD-10-CM

## 2013-11-13 ENCOUNTER — Ambulatory Visit (INDEPENDENT_AMBULATORY_CARE_PROVIDER_SITE_OTHER): Payer: BC Managed Care – PPO | Admitting: Gynecology

## 2013-11-13 ENCOUNTER — Telehealth: Payer: Self-pay | Admitting: *Deleted

## 2013-11-13 ENCOUNTER — Encounter: Payer: Self-pay | Admitting: Gynecology

## 2013-11-13 DIAGNOSIS — N920 Excessive and frequent menstruation with regular cycle: Secondary | ICD-10-CM

## 2013-11-13 DIAGNOSIS — D251 Intramural leiomyoma of uterus: Secondary | ICD-10-CM

## 2013-11-13 DIAGNOSIS — E039 Hypothyroidism, unspecified: Secondary | ICD-10-CM

## 2013-11-13 LAB — CBC WITH DIFFERENTIAL/PLATELET
BASOS PCT: 0 % (ref 0–1)
Basophils Absolute: 0 10*3/uL (ref 0.0–0.1)
Eosinophils Absolute: 0.2 10*3/uL (ref 0.0–0.7)
Eosinophils Relative: 2 % (ref 0–5)
HEMATOCRIT: 32.8 % — AB (ref 36.0–46.0)
Hemoglobin: 10.3 g/dL — ABNORMAL LOW (ref 12.0–15.0)
LYMPHS ABS: 2.5 10*3/uL (ref 0.7–4.0)
Lymphocytes Relative: 23 % (ref 12–46)
MCH: 25.1 pg — ABNORMAL LOW (ref 26.0–34.0)
MCHC: 31.4 g/dL (ref 30.0–36.0)
MCV: 80 fL (ref 78.0–100.0)
MONO ABS: 0.8 10*3/uL (ref 0.1–1.0)
Monocytes Relative: 7 % (ref 3–12)
NEUTROS ABS: 7.1 10*3/uL (ref 1.7–7.7)
Neutrophils Relative %: 68 % (ref 43–77)
Platelets: 365 10*3/uL (ref 150–400)
RBC: 4.1 MIL/uL (ref 3.87–5.11)
RDW: 15.8 % — ABNORMAL HIGH (ref 11.5–15.5)
WBC: 10.6 10*3/uL — ABNORMAL HIGH (ref 4.0–10.5)

## 2013-11-13 NOTE — Progress Notes (Signed)
The patient presents in followup from her evaluation 08/11/2013 for her menorrhagia/leiomyomata. She's decided she is ready to proceed with hysterectomy. Continues to have heavy clot-like menses. No intermenstrual bleeding. Prior ultrasound showed large uterus with multiple myomas. Endometrial biopsy benign.  Exam was Irven Shelling Abdomen with palpable pelvic mass to the level of the umbilicus. Firm nontender. Pelvic bimanual with bulky mass filling the pelvis consistent with leiomyoma.  Assessment and plan: Menorrhagia, history of iron deficiency anemia now with some intermittent lower abdominal coming and going discomfort and wants to proceed with hysterectomy. I again reviewed options to include observation, hormonal manipulation, attempted Depo-Lupron, Mirena IUD, uterine artery embolization, endometrial ablation, myomectomy, hysterectomy. The pros/cons, risks/benefits of each choice discussed. Ultimately the patient wants to proceed with hysterectomy. Given the bulk of the uterus to the level of the umbilicus and the short stature I feel a TAH would be the most appropriate approach and do not feel laparoscopic/robotic would be realistic. The ovarian conservation issue was reviewed in the options to keep both ovaries for continued hormone production accepting the risks of ovarian disease in the future to include ovarian cancer versus removing both ovaries and accepting the risk of hormone replacement therapy or symptoms of lack of estrogen reviewed. She is no strong family history of ovarian cancer and prefers to keep both ovaries excepting the risks of ovarian disease to include ovarian cancer in the future. I reviewed the expected intraoperative and postoperative courses as well as the recovery period and the generalized risks to include infection, damage to internal organs, hemorrhage necessitating transfusion the risks of transfusion. Patient wants to move toward scheduling an will represent for a full  preoperative consult. Will check CBC today if significantly low discussed possible menstrual suppression with Depo-Lupron for hemoglobin recovery.

## 2013-11-13 NOTE — Telephone Encounter (Signed)
Referral placed in epic for Dr.Gherghe, they will contact pt to schedule.

## 2013-11-13 NOTE — Patient Instructions (Signed)
Office will call you to arrange surgery. 

## 2013-11-13 NOTE — Telephone Encounter (Signed)
Message copied by Thamas Jaegers on Thu Nov 13, 2013  3:47 PM ------      Message from: Anastasio Auerbach      Created: Thu Nov 13, 2013 12:38 PM       Patient apparently missed her appointment with Dr. Suzette Battiest reference to her hypothyroidism and date we'll not schedule her another appointment. Can we see if we can get her an appointment with another endocrinologist in town. ------

## 2013-11-14 ENCOUNTER — Encounter: Payer: Self-pay | Admitting: *Deleted

## 2013-11-17 ENCOUNTER — Telehealth: Payer: Self-pay

## 2013-11-17 NOTE — Telephone Encounter (Signed)
I called patient to talk with her about scheduling TAH. We discussed her ins benefits and estimated financial responsibility.  Dr. Loetta Rough asked me to discuss with her that she could do Depo Lupron 11.25 mg if she wanted some time to increase her iron stores and lower chance of transfusion at surgery.  I explained that her surgery would be 8-12 weeks after the injection.  Patient wants time to consider all of this and will call me back.

## 2013-12-09 NOTE — Telephone Encounter (Signed)
I called patient and asked her to please call [place] office to schedule appointment

## 2013-12-11 NOTE — Telephone Encounter (Signed)
Appointment 12/19/13.

## 2013-12-19 ENCOUNTER — Encounter: Payer: Self-pay | Admitting: Internal Medicine

## 2013-12-19 ENCOUNTER — Ambulatory Visit (INDEPENDENT_AMBULATORY_CARE_PROVIDER_SITE_OTHER): Payer: BC Managed Care – PPO | Admitting: Internal Medicine

## 2013-12-19 VITALS — BP 120/64 | HR 73 | Temp 98.2°F | Resp 12 | Ht 63.0 in | Wt 218.0 lb

## 2013-12-19 DIAGNOSIS — E039 Hypothyroidism, unspecified: Secondary | ICD-10-CM

## 2013-12-19 DIAGNOSIS — E041 Nontoxic single thyroid nodule: Secondary | ICD-10-CM

## 2013-12-19 LAB — TSH: TSH: 2.11 u[IU]/mL (ref 0.35–5.50)

## 2013-12-19 LAB — T3, FREE: T3 FREE: 2.6 pg/mL (ref 2.3–4.2)

## 2013-12-19 LAB — T4, FREE: FREE T4: 0.7 ng/dL (ref 0.60–1.60)

## 2013-12-19 NOTE — Progress Notes (Signed)
Patient ID: Jessica Henry, female   DOB: 11-15-1965, 48 y.o.   MRN: [id number]    HPI  Jessica Henry is a 48 y.o.-year-old female, referred by her ObGyn Dr. Dr Phineas Real, for evaluation for a Left thyroid nodule (actually the referral was for hypothyroidism, but, per review of the chart, TSH in 07/2013 was normal, so maybe error in referral process).  Thyroid MRI performed in 2013 to investigate lateral cervical lipid masses >> incidental 9 x 13 x 17 mm well circumscribed left thyroid lobe lesion. She was referred to have this investigated. She did not have a thyroid U/S.  Pt denies feeling nodules in neck, hoarseness, dysphagia/odynophagia, SOB with lying down.  I reviewed pt's thyroid tests: Lab Results  Component Value Date   TSH 2.540 08/01/2013    Pt c/o: - + fatigue Denies: - heat intolerance/cold intolerance - tremors - palpitations - anxiety/depression - hyperdefecation/constipation - weight loss - weight gain - dry skin - hair falling  Pt does not have a FH of thyroid ds. No FH of thyroid cancer. No h/o radiation tx to head or neck.  No seaweed or kelp, no recent contrast studies. No steroid use since 10/2013. No herbal supplements.   I reviewed her chart and she also has a history of HTN.  ROS: Constitutional: no weight gain/loss, + fatigue, no subjective hyperthermia/hypothermia Eyes: no blurry vision, no xerophthalmia ENT: no sore throat, no nodules palpated in throat, no dysphagia/odynophagia, no hoarseness Cardiovascular: no CP/SOB/palpitations/leg swelling Respiratory: no cough/SOB Gastrointestinal: no N/V/D/C Musculoskeletal: no muscle/joint aches Skin: no rashes Neurological: no tremors/numbness/tingling/dizziness Psychiatric: no depression/anxiety  Past Medical History  Diagnosis Date  . Seasonal allergies   . Hypertension   . STD (sexually transmitted disease)     HSV   Past Surgical History  Procedure Laterality Date  . Cesarean section    . Tubal  ligation     History   Social History  . Marital Status: Single    Spouse Name: N/A    Number of Children: 3   Occupational History  . Not on file.   Social History Main Topics  . Smoking status: Never Smoker   . Smokeless tobacco: Never Used  . Alcohol Use: No  . Drug Use: No  . Sexual Activity: Yes    Birth Control/ Protection: Surgical     Comment: Tubal lig   Current Outpatient Prescriptions on File Prior to Visit  Medication Sig Dispense Refill  . fish oil-omega-3 fatty acids 1000 MG capsule Take 1 g by mouth every morning.       Marland Kitchen lisinopril (PRINIVIL,ZESTRIL) 10 MG tablet Take 10 mg by mouth every morning.       . mometasone (NASONEX) 50 MCG/ACT nasal spray Place 2 sprays into the nose every morning.       . Multiple Vitamin (MULTIVITAMIN) tablet Take 1 tablet by mouth every morning.       . valACYclovir (VALTREX) 500 MG tablet Take 1 tablet (500 mg total) by mouth daily as needed. For outbreaks.  30 tablet  2   No current facility-administered medications on file prior to visit.   Allergies  Allergen Reactions  . Gadolinium Derivatives Swelling and Cough    Post contrast of IV Multihance pt began coughing, sneezing and had facial swelling, Rapid Response was needed and patient was taken to ED  . Penicillins Hives   Family History  Problem Relation Age of Onset  . Diabetes Mother   . Hypertension Mother   .  Heart disease Mother    PE: BP 120/64  Pulse 73  Temp(Src) 98.2 F (36.8 C) (Oral)  Resp 12  Ht 5\' 3"  (1.6 m)  Wt 218 lb (98.884 kg)  BMI 38.63 kg/m2  SpO2 97%  LMP 12/03/2013 Wt Readings from Last 3 Encounters:  12/19/13 218 lb (98.884 kg)  08/01/13 215 lb (97.523 kg)  12/02/11 200 lb (90.719 kg)   Constitutional: overweight, in NAD Eyes: PERRLA, EOMI, no exophthalmos ENT: moist mucous membranes, large neck, no thyromegaly, no thyroid nodule palpable, no cervical lymphadenopathy Cardiovascular: RRR, No MRG Respiratory: CTA B Gastrointestinal:  abdomen soft, NT, ND, BS+ Musculoskeletal: no deformities, strength intact in all 4;  Skin: moist, warm, no rashes Neurological: no tremor with outstretched hands, DTR normal in all 4  ASSESSMENT: 1. L thyroid nodule - incidentally seen on MRI 2013  PLAN: 1. L thyroid nodule  - I reviewed the images of her thyroid MRI along with the patient. The nodule is not very large, but MRI is not very accurate to describe thyroid nodules. Pt does not have a thyroid cancer family history or a personal history of RxTx to head/neck. All these would favor benignity. - the only way that we can tell exactly if it is cancer or not is by doing a thyroid U/S and, if indicated, a biopsy (FNA). I explained what the test entails. - I explained that this is not cancer, we can continue to follow her on a yearly basis, and check another ultrasound in another year or 2. - she should let me know if she develops neck compression symptoms, in that case, we might need to do either lobectomy or thyroidectomy - I'll see her back in a year. If FNA abnormal, we will meet sooner.  - will check TFTs today just in case... - I advised pt to join my chart and I will send her the results through there, but she declines  Office Visit on 12/19/2013  Component Date Value Ref Range Status  . TSH 12/19/2013 2.11  0.35 - 5.50 uIU/mL Final  . Free T4 12/19/2013 0.70  0.60 - 1.60 ng/dL Final  . T3, Free 12/19/2013 2.6  2.3 - 4.2 pg/mL Final  TFT normal, will go ahead with Thyroid U/S.  01/25/2014 U/S results still pending. I will addend the results when they become available.

## 2013-12-19 NOTE — Patient Instructions (Signed)
Please return in a year. Please stop at the lab. You will be called with the biopsy schedule.

## 2013-12-22 ENCOUNTER — Encounter: Payer: Self-pay | Admitting: *Deleted

## 2014-02-19 ENCOUNTER — Telehealth: Payer: Self-pay | Admitting: *Deleted

## 2014-02-19 NOTE — Telephone Encounter (Signed)
Dr Cruzita Lederer ordered this pt a thyroid U/S on 12/21/13. Can you tell me if you know if there was a problem with this or if it was done out of our system? Thanks for your help.

## 2014-03-09 ENCOUNTER — Telehealth: Payer: Self-pay | Admitting: *Deleted

## 2014-03-09 NOTE — Telephone Encounter (Signed)
Yes, please give her a call. Thanks so much!

## 2014-03-09 NOTE — Telephone Encounter (Signed)
[facility] has tried calling pt several times.Also has left message with husband with no return call

## 2014-03-09 NOTE — Telephone Encounter (Signed)
Jessica Henry sent message that [facility] has tried contacting pt several times. A message was left with pt's husband to return call and schedule thyroid U/S; still no response. Do I need to contact pt? Please advise.

## 2014-03-09 NOTE — Telephone Encounter (Signed)
Thank you for following up. I will contact pt and advise her to call them to schedule.

## 2014-03-09 NOTE — Telephone Encounter (Signed)
Tried to call pt and was unable to leave a message, mailbox was full. Will try again.

## 2014-03-10 ENCOUNTER — Encounter: Payer: Self-pay | Admitting: *Deleted

## 2014-04-23 ENCOUNTER — Telehealth: Payer: Self-pay | Admitting: *Deleted

## 2014-04-23 MED ORDER — VALACYCLOVIR HCL 500 MG PO TABS: 500.0000 mg | ORAL_TABLET | Freq: Every day | ORAL | Status: DC | PRN

## 2014-04-23 NOTE — Telephone Encounter (Signed)
Pt called requesting refill on Valtrex 500 mg tablet, rx sent, annual due in Oct. 2015

## 2014-08-10 ENCOUNTER — Encounter: Payer: Self-pay | Admitting: Internal Medicine

## 2014-11-25 ENCOUNTER — Other Ambulatory Visit: Payer: Self-pay | Admitting: Gynecology

## 2014-12-16 ENCOUNTER — Ambulatory Visit (INDEPENDENT_AMBULATORY_CARE_PROVIDER_SITE_OTHER): Payer: BLUE CROSS/BLUE SHIELD | Admitting: Gynecology

## 2014-12-16 ENCOUNTER — Other Ambulatory Visit (HOSPITAL_COMMUNITY)
Admission: RE | Admit: 2014-12-16 | Discharge: 2014-12-16 | Disposition: A | Discharge status: Home / Self Care | Payer: BLUE CROSS/BLUE SHIELD | Source: Ambulatory Visit | Attending: Gynecology | Admitting: Gynecology

## 2014-12-16 ENCOUNTER — Encounter: Payer: Self-pay | Admitting: Gynecology

## 2014-12-16 VITALS — BP 124/80 | Ht 64.0 in | Wt 224.0 lb

## 2014-12-16 DIAGNOSIS — Z01419 Encounter for gynecological examination (general) (routine) without abnormal findings: Secondary | ICD-10-CM | POA: Diagnosis not present

## 2014-12-16 DIAGNOSIS — L723 Sebaceous cyst: Secondary | ICD-10-CM | POA: Diagnosis not present

## 2014-12-16 DIAGNOSIS — A609 Anogenital herpesviral infection, unspecified: Secondary | ICD-10-CM | POA: Diagnosis not present

## 2014-12-16 DIAGNOSIS — D251 Intramural leiomyoma of uterus: Secondary | ICD-10-CM | POA: Diagnosis not present

## 2014-12-16 DIAGNOSIS — Z1151 Encounter for screening for human papillomavirus (HPV): Secondary | ICD-10-CM | POA: Insufficient documentation

## 2014-12-16 DIAGNOSIS — N920 Excessive and frequent menstruation with regular cycle: Secondary | ICD-10-CM

## 2014-12-16 LAB — CBC WITH DIFFERENTIAL/PLATELET
BASOS PCT: 0 % (ref 0–1)
Basophils Absolute: 0 10*3/uL (ref 0.0–0.1)
Eosinophils Absolute: 0.3 10*3/uL (ref 0.0–0.7)
Eosinophils Relative: 3 % (ref 0–5)
HCT: 32.5 % — ABNORMAL LOW (ref 36.0–46.0)
Hemoglobin: 10.1 g/dL — ABNORMAL LOW (ref 12.0–15.0)
LYMPHS PCT: 26 % (ref 12–46)
Lymphs Abs: 2.7 10*3/uL (ref 0.7–4.0)
MCH: 23.6 pg — ABNORMAL LOW (ref 26.0–34.0)
MCHC: 31.1 g/dL (ref 30.0–36.0)
MCV: 75.9 fL — ABNORMAL LOW (ref 78.0–100.0)
MONO ABS: 0.7 10*3/uL (ref 0.1–1.0)
MPV: 9.6 fL (ref 8.6–12.4)
Monocytes Relative: 7 % (ref 3–12)
NEUTROS ABS: 6.7 10*3/uL (ref 1.7–7.7)
Neutrophils Relative %: 64 % (ref 43–77)
Platelets: 394 10*3/uL (ref 150–400)
RBC: 4.28 MIL/uL (ref 3.87–5.11)
RDW: 15.4 % (ref 11.5–15.5)
WBC: 10.5 10*3/uL (ref 4.0–10.5)

## 2014-12-16 LAB — COMPREHENSIVE METABOLIC PANEL
ALBUMIN: 4.1 g/dL (ref 3.5–5.2)
ALT: 13 U/L (ref 0–35)
AST: 16 U/L (ref 0–37)
Alkaline Phosphatase: 54 U/L (ref 39–117)
BILIRUBIN TOTAL: 0.4 mg/dL (ref 0.2–1.2)
BUN: 11 mg/dL (ref 6–23)
CHLORIDE: 101 meq/L (ref 96–112)
CO2: 24 mEq/L (ref 19–32)
CREATININE: 0.7 mg/dL (ref 0.50–1.10)
Calcium: 10.2 mg/dL (ref 8.4–10.5)
Glucose, Bld: 77 mg/dL (ref 70–99)
Potassium: 3.9 mEq/L (ref 3.5–5.3)
SODIUM: 138 meq/L (ref 135–145)
TOTAL PROTEIN: 7.2 g/dL (ref 6.0–8.3)

## 2014-12-16 LAB — LIPID PANEL
CHOLESTEROL: 155 mg/dL (ref 0–200)
HDL: 40 mg/dL — ABNORMAL LOW (ref >=46)
LDL CALC: 94 mg/dL (ref 0–99)
TRIGLYCERIDES: 105 mg/dL (ref <150)
Total CHOL/HDL Ratio: 3.9 Ratio
VLDL: 21 mg/dL (ref 0–40)

## 2014-12-16 MED ORDER — VALACYCLOVIR HCL 500 MG PO TABS: 500.0000 mg | ORAL_TABLET | Freq: Two times a day (BID) | ORAL | Status: DC

## 2014-12-16 NOTE — Addendum Note (Signed)
Addended by: Nelva Nay on: 12/16/2014 02:53 PM   Modules accepted: Orders

## 2014-12-16 NOTE — Progress Notes (Signed)
Jessica Henry 02-29-1966 [id number]        49 y.o.  [id number] for annual exam.  Several issues noted below.  Past medical history,surgical history, problem list, medications, allergies, family history and social history were all reviewed and documented as reviewed in the EPIC chart.  ROS:  Performed with pertinent positives and negatives included in the history, assessment and plan.   Additional significant findings :  none   Exam: Kim Counsellor Vitals:   12/16/14 1404  BP: 124/80  Height: 5\' 4"  (1.626 m)  Weight: 224 lb (101.606 kg)   General appearance:  Normal affect, orientation and appearance. Skin: Grossly normal HEENT: Without gross lesions.  No cervical or supraclavicular adenopathy. Thyroid normal.  Lungs:  Clear without wheezing, rales or rhonchi Cardiac: RR, without RMG Abdominal:  Soft, nontender, without , guarding, rebound, organomegaly or hernia. Uterus to within fingerbreadth of umbilicus Breasts:  Examined lying and sitting without masses, retractions, discharge or axillary adenopathy.  1.5 cm classic sebaceous cyst mid sternum just lateral to her right breast. Firm, mobile with overlying blackhead. Pelvic:  Ext/BUS/vagina normal  Cervix normal. Pap smear/HPV done  Uterus bulky to within finger breath of umbilicus.   Adnexa  Unable to evaluate secondary to uterine bulk   Anus and perineum  Normal   Rectovaginal  Normal sphincter tone without palpated masses or tenderness.    Assessment/Plan:  49 y.o. [id number] female for annual exam regular heavy menses, tubal sterilization.   1. Menorrhagia/leiomyoma. Patient was to schedule hysterectomy last year but did not do so because of finances. Her menses continue heavy practically for the first 2 days. Uterus bulky to 18 weeks size. Patient wants to proceed with hysterectomy. Given the bulk of her uterus I feel TAH most prudent. Patient wants to move toward scheduling and we will make arrangements. Check baseline CBC  today. Possible menstrual suppression for hemoglobin recovery of flow. 2. History of HSV. Occasional outbreaks. Takes Valtrex 1-2 times daily for several days at earliest sign which seems to work. Valtrex No. 30 refill 2 provided 3. Pap smear 2013. Pap smear/HPV done today. No history of significant abnormal Pap smears previously. 4. Mammography due now.  Noticed a small nodule several months ago and when she called to schedule they recommended she see me first. Exam shows a classic small sebaceous cyst overlying the mid-sternum with overlying blackhead. Does not appear to be within the breast tissue itself. Recommended patient call back and schedule a regular mammogram. If she has any issues doing so still call me. She'll follow this area as it doesn't bother her and as long as remains stable will monitor. If enlarges or becomes uncomfortable she'll call for excisional referral. 5. Health maintenance. Baseline CBC comprehensive metabolic panel lipid profile urinalysis ordered. Follow up for scheduling surgery.     Anastasio Auerbach MD, 2:33 PM 12/16/2014

## 2014-12-16 NOTE — Patient Instructions (Signed)
Office will call you to discuss surgery.  You may obtain a copy of any labs that were done today by logging onto MyChart as outlined in the instructions provided with your AVS (after visit summary). The office will not call with normal lab results but certainly if there are any significant abnormalities then we will contact you.   Health Maintenance, Female A healthy lifestyle and preventative care can promote health and wellness.  Maintain regular health, dental, and eye exams.  Eat a healthy diet. Foods like vegetables, fruits, whole grains, low-fat dairy products, and lean protein foods contain the nutrients you need without too many calories. Decrease your intake of foods high in solid fats, added sugars, and salt. Get information about a proper diet from your caregiver, if necessary.  Regular physical exercise is one of the most important things you can do for your health. Most adults should get at least 150 minutes of moderate-intensity exercise (any activity that increases your heart rate and causes you to sweat) each week. In addition, most adults need muscle-strengthening exercises on 2 or more days a week.   Maintain a healthy weight. The body mass index (BMI) is a screening tool to identify possible weight problems. It provides an estimate of body fat based on height and weight. Your caregiver can help determine your BMI, and can help you achieve or maintain a healthy weight. For adults 20 years and older:  A BMI below 18.5 is considered underweight.  A BMI of 18.5 to 24.9 is normal.  A BMI of 25 to 29.9 is considered overweight.  A BMI of 30 and above is considered obese.  Maintain normal blood lipids and cholesterol by exercising and minimizing your intake of saturated fat. Eat a balanced diet with plenty of fruits and vegetables. Blood tests for lipids and cholesterol should begin at age 95 and be repeated every 5 years. If your lipid or cholesterol levels are high, you are over  50, or you are a high risk for heart disease, you may need your cholesterol levels checked more frequently.Ongoing high lipid and cholesterol levels should be treated with medicines if diet and exercise are not effective.  If you smoke, find out from your caregiver how to quit. If you do not use tobacco, do not start.  Lung cancer screening is recommended for adults aged 46 80 years who are at high risk for developing lung cancer because of a history of smoking. Yearly low-dose computed tomography (CT) is recommended for people who have at least a 30-pack-year history of smoking and are a current smoker or have quit within the past 15 years. A pack year of smoking is smoking an average of 1 pack of cigarettes a day for 1 year (for example: 1 pack a day for 30 years or 2 packs a day for 15 years). Yearly screening should continue until the smoker has stopped smoking for at least 15 years. Yearly screening should also be stopped for people who develop a health problem that would prevent them from having lung cancer treatment.  If you are pregnant, do not drink alcohol. If you are breastfeeding, be very cautious about drinking alcohol. If you are not pregnant and choose to drink alcohol, do not exceed 1 drink per day. One drink is considered to be 12 ounces (355 mL) of beer, 5 ounces (148 mL) of wine, or 1.5 ounces (44 mL) of liquor.  Avoid use of street drugs. Do not share needles with anyone. Ask for help  if you need support or instructions about stopping the use of drugs.  High blood pressure causes heart disease and increases the risk of stroke. Blood pressure should be checked at least every 1 to 2 years. Ongoing high blood pressure should be treated with medicines, if weight loss and exercise are not effective.  If you are 55 to 47 years old, ask your caregiver if you should take aspirin to prevent strokes.  Diabetes screening involves taking a blood sample to check your fasting blood sugar level.  This should be done once every 3 years, after age 45, if you are within normal weight and without risk factors for diabetes. Testing should be considered at a younger age or be carried out more frequently if you are overweight and have at least 1 risk factor for diabetes.  Breast cancer screening is essential preventative care for women. You should practice "breast self-awareness." This means understanding the normal appearance and feel of your breasts and may include breast self-examination. Any changes detected, no matter how small, should be reported to a caregiver. Women in their 20s and 30s should have a clinical breast exam (CBE) by a caregiver as part of a regular health exam every 1 to 3 years. After age 40, women should have a CBE every year. Starting at age 40, women should consider having a mammogram (breast X-ray) every year. Women who have a family history of breast cancer should talk to their caregiver about genetic screening. Women at a high risk of breast cancer should talk to their caregiver about having an MRI and a mammogram every year.  Breast cancer gene (BRCA)-related cancer risk assessment is recommended for women who have family members with BRCA-related cancers. BRCA-related cancers include breast, ovarian, tubal, and peritoneal cancers. Having family members with these cancers may be associated with an increased risk for harmful changes (mutations) in the breast cancer genes BRCA1 and BRCA2. Results of the assessment will determine the need for genetic counseling and BRCA1 and BRCA2 testing.  The Pap test is a screening test for cervical cancer. Women should have a Pap test starting at age 21. Between ages 21 and 29, Pap tests should be repeated every 2 years. Beginning at age 30, you should have a Pap test every 3 years as long as the past 3 Pap tests have been normal. If you had a hysterectomy for a problem that was not cancer or a condition that could lead to cancer, then you no  longer need Pap tests. If you are between ages 65 and 70, and you have had normal Pap tests going back 10 years, you no longer need Pap tests. If you have had past treatment for cervical cancer or a condition that could lead to cancer, you need Pap tests and screening for cancer for at least 20 years after your treatment. If Pap tests have been discontinued, risk factors (such as a new sexual partner) need to be reassessed to determine if screening should be resumed. Some women have medical problems that increase the chance of getting cervical cancer. In these cases, your caregiver may recommend more frequent screening and Pap tests.  The human papillomavirus (HPV) test is an additional test that may be used for cervical cancer screening. The HPV test looks for the virus that can cause the cell changes on the cervix. The cells collected during the Pap test can be tested for HPV. The HPV test could be used to screen women aged 30 years and older, and   should be used in women of any age who have unclear Pap test results. After the age of 30, women should have HPV testing at the same frequency as a Pap test.  Colorectal cancer can be detected and often prevented. Most routine colorectal cancer screening begins at the age of 50 and continues through age 75. However, your caregiver may recommend screening at an earlier age if you have risk factors for colon cancer. On a yearly basis, your caregiver may provide home test kits to check for hidden blood in the stool. Use of a small camera at the end of a tube, to directly examine the colon (sigmoidoscopy or colonoscopy), can detect the earliest forms of colorectal cancer. Talk to your caregiver about this at age 50, when routine screening begins. Direct examination of the colon should be repeated every 5 to 10 years through age 75, unless early forms of pre-cancerous polyps or small growths are found.  Hepatitis C blood testing is recommended for all people born from  1945 through 1965 and any individual with known risks for hepatitis C.  Practice safe sex. Use condoms and avoid high-risk sexual practices to reduce the spread of sexually transmitted infections (STIs). Sexually active women aged 25 and younger should be checked for Chlamydia, which is a common sexually transmitted infection. Older women with new or multiple partners should also be tested for Chlamydia. Testing for other STIs is recommended if you are sexually active and at increased risk.  Osteoporosis is a disease in which the bones lose minerals and strength with aging. This can result in serious bone fractures. The risk of osteoporosis can be identified using a bone density scan. Women ages 65 and over and women at risk for fractures or osteoporosis should discuss screening with their caregivers. Ask your caregiver whether you should be taking a calcium supplement or vitamin D to reduce the rate of osteoporosis.  Menopause can be associated with physical symptoms and risks. Hormone replacement therapy is available to decrease symptoms and risks. You should talk to your caregiver about whether hormone replacement therapy is right for you.  Use sunscreen. Apply sunscreen liberally and repeatedly throughout the day. You should seek shade when your shadow is shorter than you. Protect yourself by wearing long sleeves, pants, a wide-brimmed hat, and sunglasses year round, whenever you are outdoors.  Notify your caregiver of new moles or changes in moles, especially if there is a change in shape or color. Also notify your caregiver if a mole is larger than the size of a pencil eraser.  Stay current with your immunizations. Document Released: 04/10/2011 Document Revised: 01/20/2013 Document Reviewed: 04/10/2011 [employer] Patient Information 2014 [employer].   

## 2014-12-17 LAB — URINALYSIS W MICROSCOPIC + REFLEX CULTURE
Bilirubin Urine: NEGATIVE
Casts: NONE SEEN
Crystals: NONE SEEN
Glucose, UA: NEGATIVE mg/dL
Hgb urine dipstick: NEGATIVE
KETONES UR: NEGATIVE mg/dL
Leukocytes, UA: NEGATIVE
Nitrite: NEGATIVE
Protein, ur: NEGATIVE mg/dL
SQUAMOUS EPITHELIAL / LPF: NONE SEEN
Specific Gravity, Urine: 1.01 (ref 1.005–1.030)
UROBILINOGEN UA: 0.2 mg/dL (ref 0.0–1.0)
pH: 7 (ref 5.0–8.0)

## 2014-12-18 ENCOUNTER — Telehealth: Payer: Self-pay

## 2014-12-18 LAB — CYTOLOGY - PAP

## 2014-12-18 NOTE — Telephone Encounter (Signed)
I received the following staff message for: "Patient interested in proceeding with hysterectomy. Hemoglobin 10. Recommend Depo-Lupron 11.75 and surgery at the 2-3 month window. Supplement Iron daily. Tell patient due to lower hemoglobin she would be at increased risk of transfusion and we can help to minimize this with this protocol. "  I called patient and left message on cell phone vm asking her to call me.

## 2014-12-19 LAB — URINE CULTURE

## 2015-01-05 ENCOUNTER — Other Ambulatory Visit: Payer: Self-pay | Admitting: Gynecology

## 2015-01-05 MED ORDER — SULFAMETHOXAZOLE-TRIMETHOPRIM 800-160 MG PO TABS: 1.0000 | ORAL_TABLET | Freq: Two times a day (BID) | ORAL | Status: DC

## 2015-01-05 NOTE — Telephone Encounter (Signed)
#  1 Patient informed. Rx sent.  #2  Patient said she has no religious beliefs against transfusion and will be open to having a transfusion if needed.

## 2015-01-05 NOTE — Telephone Encounter (Signed)
#  1 I did not see the urine culture yet and I'm not sure what the issue is with that but yesterday do want to treat her. Septra DS 1 by mouth twice a day 3 days. #2 I agree with proceeding with Depo-Lupron and scheduling the surgery at the 2-3 month window. Also verify with patient she has no religious objections to an transfusion if this would be necessary.

## 2015-01-05 NOTE — Telephone Encounter (Signed)
Patient called me and we discussed her insurance benefits and estimated cost responsibility to [facility].  She does want to proceed with Lupron. I will send that along.  Dr Loetta Rough- IN the course of this conversation patient asked about her labs. Upon review I see that her urine culture was >100,000 E. Coli but still indicates pending status so I guess it has not come to you for review.  Do you want to treat her?

## 2015-01-06 NOTE — Telephone Encounter (Signed)
Rx and order sent to [facility].

## 2015-01-28 ENCOUNTER — Telehealth: Payer: Self-pay

## 2015-01-28 NOTE — Telephone Encounter (Signed)
I called patient and left detailed message per DPR access note.  I advised her I received note from [facility] regarding her Lupron and that they are needing to speak with her for her to confirm shipment okay.  I provided her with their phone number and asked her to give them a call so that she can proceed with getting the Lupron like Dr. Loetta Rough had recommended.  I asked her to call me if any questions and left my direct number.

## 2015-03-11 ENCOUNTER — Telehealth: Payer: Self-pay

## 2015-03-11 NOTE — Telephone Encounter (Signed)
Patient called because she has spoken with specialty pharmacy about Lupron. She questioned if she should have it sent to her since she does not know how to inject herself. I told her we usually have the pharmacy ship it to the office and I will call her upon its arrival and schedule her to receive inj. She is fine with that and will let them know.

## 2015-04-14 ENCOUNTER — Other Ambulatory Visit: Payer: Self-pay | Admitting: Gynecology

## 2015-04-14 DIAGNOSIS — Z1231 Encounter for screening mammogram for malignant neoplasm of breast: Secondary | ICD-10-CM

## 2015-04-15 ENCOUNTER — Telehealth: Payer: Self-pay

## 2015-04-15 ENCOUNTER — Ambulatory Visit (HOSPITAL_COMMUNITY)
Admission: RE | Admit: 2015-04-15 | Discharge: 2015-04-15 | Disposition: A | Discharge status: Home / Self Care | Payer: BLUE CROSS/BLUE SHIELD | Source: Ambulatory Visit | Attending: Gynecology | Admitting: Gynecology

## 2015-04-15 DIAGNOSIS — Z1231 Encounter for screening mammogram for malignant neoplasm of breast: Secondary | ICD-10-CM | POA: Insufficient documentation

## 2015-04-15 NOTE — Telephone Encounter (Signed)
Anderson Malta took a call today from a man at [facility] today stating that Lupron was being placed on hold because they had not been able to contact patient.  I called patient because on 03/11/15 she had contacted me regarding Rx and she had indeed spoken with them. She confirmed to me that she did speak with them and told them to ship it and they indicated they would. She had thought it was taking a long time.  She will call them today and authorize shipment again. I provided her with the phone number that they indicated she should call.

## 2015-06-08 ENCOUNTER — Telehealth: Payer: Self-pay

## 2015-06-08 NOTE — Telephone Encounter (Signed)
I never received Lupron for patient and last conversation she was going to okay it with the company.  I called and left message with man who answered and asked him to have her call me. I want to follow up and see what her plans are in regards to Lupron and hysterectomy.

## 2015-07-08 ENCOUNTER — Encounter: Payer: Self-pay | Admitting: Gynecology

## 2015-07-08 ENCOUNTER — Ambulatory Visit (INDEPENDENT_AMBULATORY_CARE_PROVIDER_SITE_OTHER): Payer: BLUE CROSS/BLUE SHIELD | Admitting: Gynecology

## 2015-07-08 VITALS — BP 124/80

## 2015-07-08 DIAGNOSIS — L02213 Cutaneous abscess of chest wall: Secondary | ICD-10-CM

## 2015-07-08 MED ORDER — DOXYCYCLINE HYCLATE 100 MG PO CAPS: 100.0000 mg | ORAL_CAPSULE | Freq: Two times a day (BID) | ORAL | Status: DC

## 2015-07-08 NOTE — Progress Notes (Signed)
Jessica Henry Jan 30, 1966 [id number]        49 y.o.  [id number] Presents complaining of tender skin nodule.  Patient was seen earlier this year with a classic sebaceous cyst mid sternum lateral to the right breast. Notes that it became more tender with swelling of the past several days.  Past medical history,surgical history, problem list, medications, allergies, family history and social history were all reviewed and documented in the EPIC chart.  Directed ROS with pertinent positives and negatives documented in the history of present illness/assessment and plan.  Exam: Kim assistant Filed Vitals:   07/08/15 1108  BP: 124/80   General appearance:  Normal Anterior chest with 2 cm swollen fluctuant area at the site of the sebaceous cyst. Overlying blackhead noted. Very tender to the patient. No significant cellulitis.  Procedure: Skin overlying the abscess was cleansed with Betadine and infiltrated with 1% lidocaine. Using a scalpel a stab incision was made into the abscess cavity with extrusion of pus. Area was probed with small forceps and drained of the pus. Small packing was placed. Sterile dressing applied.  Assessment/Plan:  49 y.o. [id number]  With small infected sebaceous cyst. Options for management to include expectant with warm soaks versus I&D discussed.  Patient prefers to have it drained as above procedure. Postop instructions given to include warm soaks and doxycycline 100 mg twice a day 7 days.  Follow up if this area worsens or persists. Otherwise follow up in 2 weeks for baseline exam. If residual cyst then consider referral to general surgery to have it excised after the acute inflammation has resolved.    Anastasio Auerbach MD, 11:34 AM 07/08/2015

## 2015-07-08 NOTE — Patient Instructions (Signed)
Take the antibiotic twice daily. Apply warm soaks to the abscess area. Call if this area seems to be getting worse. Follow up in 2-3 weeks for recheck

## 2015-07-12 ENCOUNTER — Telehealth: Payer: Self-pay

## 2015-07-12 NOTE — Telephone Encounter (Signed)
I have never heard back from patient regarding Lupron. I left message with her husband at home number and asked for patient to call me.

## 2015-07-23 ENCOUNTER — Ambulatory Visit (INDEPENDENT_AMBULATORY_CARE_PROVIDER_SITE_OTHER): Payer: BLUE CROSS/BLUE SHIELD | Admitting: Gynecology

## 2015-07-23 ENCOUNTER — Encounter: Payer: Self-pay | Admitting: Gynecology

## 2015-07-23 VITALS — BP 120/78

## 2015-07-23 DIAGNOSIS — L02213 Cutaneous abscess of chest wall: Secondary | ICD-10-CM

## 2015-07-23 NOTE — Patient Instructions (Signed)
Office will contact you to arrange for surgeon appointment.

## 2015-07-23 NOTE — Progress Notes (Signed)
Jessica Henry [birth year] [id number]        49 y.o.  [id number] patient presents in follow up of her anterior chest wall abscess that was incised and drained 07/08/2015. She was on doxycycline for a week. Notes that she is much improved but still draining.  Past medical history,surgical history, problem list, medications, allergies, family history and social history were all reviewed and documented in the EPIC chart.  Directed ROS with pertinent positives and negatives documented in the history of present illness/assessment and plan.  Exam: Tim Counsellor Vitals:   07/23/15 1113  BP: 120/78   General appearance:  Normal Anterior chest wall with 2 cm fluctuant area with pinpoint pus training opening. No cellulitis or significant inflammation.  Assessment/Plan:  49 y.o. [id number]  With chronically draining small abscess.  Most likely infected sebaceous cyst. Will require more aggressive incision and drainage. Will refer to general surgery for evaluation and treatment and she agrees with this will help her make that appointment.    Anastasio Auerbach MD, 11:34 AM 07/23/2015

## 2015-07-26 ENCOUNTER — Telehealth: Payer: Self-pay | Admitting: *Deleted

## 2015-07-26 NOTE — Telephone Encounter (Signed)
-----   Message from Anastasio Auerbach, MD sent at 07/23/2015 11:32 AM EDT ----- General surgeon appointment for next week reference chronic draining anterior chest wall abscess

## 2015-07-26 NOTE — Telephone Encounter (Signed)
Appointment on 07/26/15 @ 2:45pm with Dr.Thompson, called patient on her cell [phone]. Pt aware with time and date.

## 2015-08-09 ENCOUNTER — Other Ambulatory Visit: Payer: Self-pay | Admitting: Surgery

## 2015-08-19 ENCOUNTER — Other Ambulatory Visit: Payer: Self-pay

## 2015-08-19 MED ORDER — MOMETASONE FUROATE 50 MCG/ACT NA SUSP: 1.0000 | Freq: Two times a day (BID) | NASAL | Status: DC

## 2015-11-25 ENCOUNTER — Ambulatory Visit (INDEPENDENT_AMBULATORY_CARE_PROVIDER_SITE_OTHER): Payer: BLUE CROSS/BLUE SHIELD | Admitting: Allergy and Immunology

## 2015-11-25 ENCOUNTER — Encounter: Payer: Self-pay | Admitting: Allergy and Immunology

## 2015-11-25 VITALS — BP 138/74 | HR 78 | Resp 16 | Ht 62.6 in | Wt 227.3 lb

## 2015-11-25 DIAGNOSIS — J309 Allergic rhinitis, unspecified: Secondary | ICD-10-CM

## 2015-11-25 DIAGNOSIS — H1010 Acute atopic conjunctivitis, unspecified eye: Secondary | ICD-10-CM | POA: Diagnosis not present

## 2015-11-25 MED ORDER — MOMETASONE FUROATE 50 MCG/ACT NA SUSP: NASAL | Status: AC

## 2015-11-25 NOTE — Patient Instructions (Signed)
  1. Nasonex 2 sprays each nostril one time per day  2. Over-the-counter antihistamine if needed  3. Over-the-counter proton pump inhibitor for reflux if needed  4. Return to clinic in 1 year

## 2015-11-25 NOTE — Progress Notes (Signed)
Follow-up Note  Referring Provider: Myrlene Broker, MD Primary Provider: Myrlene Broker, MD Date of Office Visit: 11/25/2015  Subjective:   Jessica Henry (DOB: January 27, 1966) is a 50 y.o. female who returns to the [place] on 11/25/2015 in re-evaluation of the following:  HPI Comments: Jessica Henry presents this clinic in reevaluation of her allergic rhinoconjunctivitis. For the most part she's done relatively well while using her Nasonex on a daily basis. She still has some occasional throat clearing and postnasal drip. She still has some occasional reflux when she eats pizza or tomato based sauces. She doesn't really treat her reflux very often. Otherwise, she is done very good and has not had any recurrent sinus infections or problems with headaches or other issues involving her upper airway.   Current Outpatient Prescriptions on File Prior to Visit  Medication Sig Dispense Refill  . fish oil-omega-3 fatty acids 1000 MG capsule Take 1 g by mouth every morning.     Marland Kitchen lisinopril (PRINIVIL,ZESTRIL) 10 MG tablet Take 10 mg by mouth every morning.     . mometasone (NASONEX) 50 MCG/ACT nasal spray Place 1 spray into the nose 2 (two) times daily. One spray in each nostril 1 to 2 times daily 17 g 5  . Multiple Vitamin (MULTIVITAMIN) tablet Take 1 tablet by mouth every morning.     . valACYclovir (VALTREX) 500 MG tablet Take 1 tablet (500 mg total) by mouth 2 (two) times daily. For outbreaks. (Patient not taking: Reported on 11/25/2015) 30 tablet 2   No current facility-administered medications on file prior to visit.    Past Medical History  Diagnosis Date  . Seasonal allergies   . Hypertension   . STD (sexually transmitted disease)     HSV    Past Surgical History  Procedure Laterality Date  . Cesarean section    . Tubal ligation      Allergies  Allergen Reactions  . Gadolinium Derivatives Swelling and Cough    Post contrast of IV Multihance pt began coughing,  sneezing and had facial swelling, Rapid Response was needed and patient was taken to ED  . Penicillins Hives    Review of systems negative except as noted in HPI / PMHx or noted below:  Review of Systems  Constitutional: Negative.   HENT: Negative.   Eyes: Negative.   Respiratory: Negative.   Cardiovascular: Negative.   Gastrointestinal: Negative.   Genitourinary: Negative.   Musculoskeletal: Negative.   Skin: Negative.   Neurological: Negative.   Endo/Heme/Allergies: Negative.   Psychiatric/Behavioral: Negative.      Objective:   Filed Vitals:   11/25/15 1635  BP: 138/74  Pulse: 78  Resp: 16   Height: 5' 2.6" (159 cm)  Weight: 227 lb 4.7 oz (103.1 kg)   Physical Exam  Constitutional: She is well-developed, well-nourished, and in no distress.  HENT:  Head: Normocephalic.  Right Ear: Tympanic membrane, external ear and ear canal normal.  Left Ear: Tympanic membrane, external ear and ear canal normal.  Nose: Nose normal. No mucosal edema or rhinorrhea.  Mouth/Throat: Uvula is midline, oropharynx is clear and moist and mucous membranes are normal. No oropharyngeal exudate.  Eyes: Conjunctivae are normal.  Neck: Trachea normal. No tracheal tenderness present. No tracheal deviation present. No thyromegaly present.  Cardiovascular: Normal rate, regular rhythm, S1 normal, S2 normal and normal heart sounds.   No murmur heard. Pulmonary/Chest: Breath sounds normal. No stridor. No respiratory distress. She has no wheezes. She has  no rales.  Musculoskeletal: She exhibits no edema.  Lymphadenopathy:       Head (right side): No tonsillar adenopathy present.       Head (left side): No tonsillar adenopathy present.    She has no cervical adenopathy.    She has no axillary adenopathy.  Neurological: She is alert. Gait normal.  Skin: No rash noted. She is not diaphoretic. No erythema. Nails show no clubbing.  Psychiatric: Mood and affect normal.    Diagnostics:  none  Assessment and Plan:   1. Allergic rhinoconjunctivitis     1. Nasonex 2 sprays each nostril one time per day  2. Over-the-counter antihistamine if needed  3. Over-the-counter proton pump inhibitor for reflux if needed  4. Return to clinic in 1 year  Sunee has done relatively well and now we'll see her back in this clinic in 1 year or earlier if there is a problem. I did have a talk with her today about some of her throat clearing and postnasal drip possibly being a reflection of GERD in the form of LPR. She appears to have a good understanding about this issue and understand how the medications for this disease date work. She is very atopic and if she has a difficult time as we go through the seasons of the year she'll contact me for further evaluation and treatment. Otherwise, I will see her back in this clinic in 1 year.  Allena Katz, MD [place]

## 2015-12-06 ENCOUNTER — Other Ambulatory Visit: Payer: Self-pay

## 2015-12-06 MED ORDER — VALACYCLOVIR HCL 500 MG PO TABS: 500.0000 mg | ORAL_TABLET | Freq: Two times a day (BID) | ORAL | Status: DC

## 2016-03-30 ENCOUNTER — Other Ambulatory Visit: Payer: Self-pay

## 2016-03-30 MED ORDER — VALACYCLOVIR HCL 500 MG PO TABS: 500.0000 mg | ORAL_TABLET | Freq: Two times a day (BID) | ORAL | Status: DC

## 2016-03-30 NOTE — Telephone Encounter (Signed)
CE overdue since March but is scheduled for 05/15/16.

## 2016-05-15 ENCOUNTER — Ambulatory Visit (INDEPENDENT_AMBULATORY_CARE_PROVIDER_SITE_OTHER): Payer: No Typology Code available for payment source | Admitting: Gynecology

## 2016-05-15 ENCOUNTER — Encounter: Payer: Self-pay | Admitting: Gynecology

## 2016-05-15 VITALS — BP 126/82 | Ht 63.5 in | Wt 223.0 lb

## 2016-05-15 DIAGNOSIS — Z1329 Encounter for screening for other suspected endocrine disorder: Secondary | ICD-10-CM | POA: Diagnosis not present

## 2016-05-15 DIAGNOSIS — N920 Excessive and frequent menstruation with regular cycle: Secondary | ICD-10-CM | POA: Diagnosis not present

## 2016-05-15 DIAGNOSIS — A609 Anogenital herpesviral infection, unspecified: Secondary | ICD-10-CM

## 2016-05-15 DIAGNOSIS — D251 Intramural leiomyoma of uterus: Secondary | ICD-10-CM

## 2016-05-15 DIAGNOSIS — Z01419 Encounter for gynecological examination (general) (routine) without abnormal findings: Secondary | ICD-10-CM

## 2016-05-15 LAB — CBC WITH DIFFERENTIAL/PLATELET
BASOS ABS: 0 {cells}/uL (ref 0–200)
Basophils Relative: 0 %
EOS ABS: 306 {cells}/uL (ref 15–500)
Eosinophils Relative: 3 %
HCT: 33.2 % — ABNORMAL LOW (ref 35.0–45.0)
Hemoglobin: 10.2 g/dL — ABNORMAL LOW (ref 11.7–15.5)
Lymphocytes Relative: 28 %
Lymphs Abs: 2856 cells/uL (ref 850–3900)
MCH: 24.4 pg — ABNORMAL LOW (ref 27.0–33.0)
MCHC: 30.7 g/dL — AB (ref 32.0–36.0)
MCV: 79.4 fL — ABNORMAL LOW (ref 80.0–100.0)
MONOS PCT: 8 %
MPV: 9.9 fL (ref 7.5–12.5)
Monocytes Absolute: 816 cells/uL (ref 200–950)
Neutro Abs: 6222 cells/uL (ref 1500–7800)
Neutrophils Relative %: 61 %
PLATELETS: 337 10*3/uL (ref 140–400)
RBC: 4.18 MIL/uL (ref 3.80–5.10)
RDW: 15.6 % — ABNORMAL HIGH (ref 11.0–15.0)
WBC: 10.2 10*3/uL (ref 3.8–10.8)

## 2016-05-15 LAB — LIPID PANEL
CHOL/HDL RATIO: 4 ratio (ref <=5.0)
Cholesterol: 148 mg/dL (ref 125–200)
HDL: 37 mg/dL — ABNORMAL LOW (ref >=46)
LDL Cholesterol: 74 mg/dL (ref <130)
Triglycerides: 185 mg/dL — ABNORMAL HIGH (ref <150)
VLDL: 37 mg/dL — AB (ref <30)

## 2016-05-15 LAB — COMPREHENSIVE METABOLIC PANEL
ALT: 14 U/L (ref 6–29)
AST: 15 U/L (ref 10–35)
Albumin: 4.3 g/dL (ref 3.6–5.1)
Alkaline Phosphatase: 49 U/L (ref 33–115)
BUN: 10 mg/dL (ref 7–25)
CHLORIDE: 103 mmol/L (ref 98–110)
CO2: 26 mmol/L (ref 20–31)
Calcium: 9.6 mg/dL (ref 8.6–10.2)
Creat: 0.57 mg/dL (ref 0.50–1.10)
Glucose, Bld: 86 mg/dL (ref 65–99)
Potassium: 4.2 mmol/L (ref 3.5–5.3)
Sodium: 138 mmol/L (ref 135–146)
Total Bilirubin: 0.3 mg/dL (ref 0.2–1.2)
Total Protein: 6.8 g/dL (ref 6.1–8.1)

## 2016-05-15 MED ORDER — VALACYCLOVIR HCL 500 MG PO TABS: 500.0000 mg | ORAL_TABLET | Freq: Every day | ORAL | 12 refills | Status: DC

## 2016-05-15 NOTE — Patient Instructions (Addendum)
Schedule your mammogram  You may obtain a copy of any labs that were done today by logging onto MyChart as outlined in the instructions provided with your AVS (after visit summary). The office will not call with normal lab results but certainly if there are any significant abnormalities then we will contact you.   Health Maintenance Adopting a healthy lifestyle and getting preventive care can go a long way to promote health and wellness. Talk with your health care provider about what schedule of regular examinations is right for you. This is a good chance for you to check in with your provider about disease prevention and staying healthy. In between checkups, there are plenty of things you can do on your own. Experts have done a lot of research about which lifestyle changes and preventive measures are most likely to keep you healthy. Ask your health care provider for more information. WEIGHT AND DIET  Eat a healthy diet  Be sure to include plenty of vegetables, fruits, low-fat dairy products, and lean protein.  Do not eat a lot of foods high in solid fats, added sugars, or salt.  Get regular exercise. This is one of the most important things you can do for your health.  Most adults should exercise for at least 150 minutes each week. The exercise should increase your heart rate and make you sweat (moderate-intensity exercise).  Most adults should also do strengthening exercises at least twice a week. This is in addition to the moderate-intensity exercise.  Maintain a healthy weight  Body mass index (BMI) is a measurement that can be used to identify possible weight problems. It estimates body fat based on height and weight. Your health care provider can help determine your BMI and help you achieve or maintain a healthy weight.  For females 20 years of age and older:   A BMI below 18.5 is considered underweight.  A BMI of 18.5 to 24.9 is normal.  A BMI of 25 to 29.9 is considered  overweight.  A BMI of 30 and above is considered obese.  Watch levels of cholesterol and blood lipids  You should start having your blood tested for lipids and cholesterol at 48 years of age, then have this test every 5 years.  You may need to have your cholesterol levels checked more often if:  Your lipid or cholesterol levels are high.  You are older than 48 years of age.  You are at high risk for heart disease.  CANCER SCREENING   Lung Cancer  Lung cancer screening is recommended for adults 55-80 years old who are at high risk for lung cancer because of a history of smoking.  A yearly low-dose CT scan of the lungs is recommended for people who:  Currently smoke.  Have quit within the past 15 years.  Have at least a 30-pack-year history of smoking. A pack year is smoking an average of one pack of cigarettes a day for 1 year.  Yearly screening should continue until it has been 15 years since you quit.  Yearly screening should stop if you develop a health problem that would prevent you from having lung cancer treatment.  Breast Cancer  Practice breast self-awareness. This means understanding how your breasts normally appear and feel.  It also means doing regular breast self-exams. Let your health care provider know about any changes, no matter how small.  If you are in your 20s or 30s, you should have a clinical breast exam (CBE) by a health   health care provider every 1-3 years as part of a regular health exam.  If you are 26 or older, have a CBE every year. Also consider having a breast X-ray (mammogram) every year.  If you have a family history of breast cancer, talk to your health care provider about genetic screening.  If you are at high risk for breast cancer, talk to your health care provider about having an MRI and a mammogram every year.  Breast cancer gene (BRCA) assessment is recommended for women who have family members with BRCA-related cancers. BRCA-related  cancers include:  Breast.  Ovarian.  Tubal.  Peritoneal cancers.  Results of the assessment will determine the need for genetic counseling and BRCA1 and BRCA2 testing. Cervical Cancer Routine pelvic examinations to screen for cervical cancer are no longer recommended for nonpregnant women who are considered low risk for cancer of the pelvic organs (ovaries, uterus, and vagina) and who do not have symptoms. A pelvic examination may be necessary if you have symptoms including those associated with pelvic infections. Ask your health care provider if a screening pelvic exam is right for you.   The Pap test is the screening test for cervical cancer for women who are considered at risk.  If you had a hysterectomy for a problem that was not cancer or a condition that could lead to cancer, then you no longer need Pap tests.  If you are older than 65 years, and you have had normal Pap tests for the past 10 years, you no longer need to have Pap tests.  If you have had past treatment for cervical cancer or a condition that could lead to cancer, you need Pap tests and screening for cancer for at least 20 years after your treatment.  If you no longer get a Pap test, assess your risk factors if they change (such as having a new sexual partner). This can affect whether you should start being screened again.  Some women have medical problems that increase their chance of getting cervical cancer. If this is the case for you, your health care provider may recommend more frequent screening and Pap tests.  The human papillomavirus (HPV) test is another test that may be used for cervical cancer screening. The HPV test looks for the virus that can cause cell changes in the cervix. The cells collected during the Pap test can be tested for HPV.  The HPV test can be used to screen women 22 years of age and older. Getting tested for HPV can extend the interval between normal Pap tests from three to five  years.  An HPV test also should be used to screen women of any age who have unclear Pap test results.  After 48 years of age, women should have HPV testing as often as Pap tests.  Colorectal Cancer  This type of cancer can be detected and often prevented.  Routine colorectal cancer screening usually begins at 48 years of age and continues through 48 years of age.  Your health care provider may recommend screening at an earlier age if you have risk factors for colon cancer.  Your health care provider may also recommend using home test kits to check for hidden blood in the stool.  A small camera at the end of a tube can be used to examine your colon directly (sigmoidoscopy or colonoscopy). This is done to check for the earliest forms of colorectal cancer.  Routine screening usually begins at age 60.  Direct examination of  colon should be repeated every 5-10 years through 48 years of age. However, you may need to be screened more often if early forms of precancerous polyps or small growths are found. Skin Cancer  Check your skin from head to toe regularly.  Tell your health care provider about any new moles or changes in moles, especially if there is a change in a mole's shape or color.  Also tell your health care provider if you have a mole that is larger than the size of a pencil eraser.  Always use sunscreen. Apply sunscreen liberally and repeatedly throughout the day.  Protect yourself by wearing long sleeves, pants, a wide-brimmed hat, and sunglasses whenever you are outside. HEART DISEASE, DIABETES, AND HIGH BLOOD PRESSURE   Have your blood pressure checked at least every 1-2 years. High blood pressure causes heart disease and increases the risk of stroke.  If you are between 55 years and 79 years old, ask your health care provider if you should take aspirin to prevent strokes.  Have regular diabetes screenings. This involves taking a blood sample to check your fasting  blood sugar level.  If you are at a normal weight and have a low risk for diabetes, have this test once every three years after 48 years of age.  If you are overweight and have a high risk for diabetes, consider being tested at a younger age or more often. PREVENTING INFECTION  Hepatitis B  If you have a higher risk for hepatitis B, you should be screened for this virus. You are considered at high risk for hepatitis B if:  You were born in a country where hepatitis B is common. Ask your health care provider which countries are considered high risk.  Your parents were born in a high-risk country, and you have not been immunized against hepatitis B (hepatitis B vaccine).  You have HIV or AIDS.  You use needles to inject street drugs.  You live with someone who has hepatitis B.  You have had sex with someone who has hepatitis B.  You get hemodialysis treatment.  You take certain medicines for conditions, including cancer, organ transplantation, and autoimmune conditions. Hepatitis C  Blood testing is recommended for:  Everyone born from 1945 through 1965.  Anyone with known risk factors for hepatitis C. Sexually transmitted infections (STIs)  You should be screened for sexually transmitted infections (STIs) including gonorrhea and chlamydia if:  You are sexually active and are younger than 48 years of age.  You are older than 48 years of age and your health care provider tells you that you are at risk for this type of infection.  Your sexual activity has changed since you were last screened and you are at an increased risk for chlamydia or gonorrhea. Ask your health care provider if you are at risk.  If you do not have HIV, but are at risk, it may be recommended that you take a prescription medicine daily to prevent HIV infection. This is called pre-exposure prophylaxis (PrEP). You are considered at risk if:  You are sexually active and do not regularly use condoms or know  the HIV status of your partner(s).  You take drugs by injection.  You are sexually active with a partner who has HIV. Talk with your health care provider about whether you are at high risk of being infected with HIV. If you choose to begin PrEP, you should first be tested for HIV. You should then be tested every 3 months   for as long as you are taking PrEP.  PREGNANCY   If you are premenopausal and you may become pregnant, ask your health care provider about preconception counseling.  If you may become pregnant, take 400 to 800 micrograms (mcg) of folic acid every day.  If you want to prevent pregnancy, talk to your health care provider about birth control (contraception). OSTEOPOROSIS AND MENOPAUSE   Osteoporosis is a disease in which the bones lose minerals and strength with aging. This can result in serious bone fractures. Your risk for osteoporosis can be identified using a bone density scan.  If you are 65 years of age or older, or if you are at risk for osteoporosis and fractures, ask your health care provider if you should be screened.  Ask your health care provider whether you should take a calcium or vitamin D supplement to lower your risk for osteoporosis.  Menopause may have certain physical symptoms and risks.  Hormone replacement therapy may reduce some of these symptoms and risks. Talk to your health care provider about whether hormone replacement therapy is right for you.  HOME CARE INSTRUCTIONS   Schedule regular health, dental, and eye exams.  Stay current with your immunizations.   Do not use any tobacco products including cigarettes, chewing tobacco, or electronic cigarettes.  If you are pregnant, do not drink alcohol.  If you are breastfeeding, limit how much and how often you drink alcohol.  Limit alcohol intake to no more than 1 drink per day for nonpregnant women. One drink equals 12 ounces of beer, 5 ounces of wine, or 1 ounces of hard liquor.  Do not  use street drugs.  Do not share needles.  Ask your health care provider for help if you need support or information about quitting drugs.  Tell your health care provider if you often feel depressed.  Tell your health care provider if you have ever been abused or do not feel safe at home. Document Released: 04/10/2011 Document Revised: 02/09/2014 Document Reviewed: 08/27/2013 ExitCare Patient Information 2015 [employer]. This information is not intended to replace advice given to you by your health care provider. Make sure you discuss any questions you have with your health care provider.  

## 2016-05-15 NOTE — Progress Notes (Signed)
    Jessica Henry 03/12/1966 [id number]        50 y.o.  [id number]  for annual exam.  Several issues noted below.  Past medical history,surgical history, problem list, medications, allergies, family history and social history were all reviewed and documented as reviewed in the EPIC chart.  ROS:  Performed with pertinent positives and negatives included in the history, assessment and plan.   Additional significant findings :  None   Exam: Jessica Henry assistant Vitals:   05/15/16 1415  BP: 126/82  Weight: 223 lb (101.2 kg)  Height: 5' 3.5" (1.613 m)   Body mass index is 38.88 kg/m.  General appearance:  Normal affect, orientation and appearance. Skin: Grossly normal HEENT: Without gross lesions.  No cervical or supraclavicular adenopathy. Thyroid normal.  Lungs:  Clear without wheezing, rales or rhonchi Cardiac: RR, without RMG Abdominal:  Soft, nontender, with palpable pelvic mass to level the umbilicus consistent with her history of leiomyoma. No tenderness rebound Breasts:  Examined lying and sitting without masses, retractions, discharge or axillary adenopathy. Pelvic:  Ext/BUS/Vagina normal  Cervix normal  Uterus bulky to the level the umbilicus  Adnexa unable to evaluate due to the uterine enlargement  Anus and perineum normal   Rectovaginal normal sphincter tone without palpated masses or tenderness.    Assessment/Plan:  50 y.o. [id number] female for annual exam with regular menses, tubal sterilization.   1. Leiomyoma. Patients menses continue relatively heavy. Was planning hysterectomy last year but due to job loss and insurance loss postpone it. Has just started a new job and plans to proceed with hysterectomy this year. I reviewed with her that her uterus is large prohibiting evaluation for other pelvic pathology. She is feeling abdominal discomfort from her leiomyoma. She's going to call us when she is ready to proceed with hysterectomy. Uterus palpates the same size as  previously. Options for ultrasound at this point although given the stability of exam we'll follow for now. Check baseline CBC. 2. History of HSV. Having more frequent outbreaks now due to stress with her job loss. Would prefer to take daily for suppression. Valtrex 500 mg #30 with 12 refills provided. 3. Pap smear/HPV normal 2016. No Pap smear done today. No history of significant abnormal Pap smears previously. 4. Mammography due now. Patient knows to call and schedule. SBE monthly reviewed. 5. Health maintenance. Patient requests baseline labs. CBC, CMP, lipid profile, urinalysis ordered. Follow up in one year, sooner if she decides to proceed with surgery.   Jessica Auerbach MD, 2:33 PM 05/15/2016

## 2016-05-16 LAB — URINALYSIS W MICROSCOPIC + REFLEX CULTURE
Bilirubin Urine: NEGATIVE
Glucose, UA: NEGATIVE
Ketones, ur: NEGATIVE
Leukocytes, UA: NEGATIVE
NITRITE: NEGATIVE
Specific Gravity, Urine: 1.02 (ref 1.001–1.035)
YEAST: NONE SEEN [HPF]
pH: 8 (ref 5.0–8.0)

## 2016-05-18 LAB — URINE CULTURE: Organism ID, Bacteria: 10000

## 2016-06-13 ENCOUNTER — Telehealth: Payer: Self-pay

## 2016-06-13 ENCOUNTER — Other Ambulatory Visit: Payer: Self-pay | Admitting: Gynecology

## 2016-06-13 DIAGNOSIS — D500 Iron deficiency anemia secondary to blood loss (chronic): Secondary | ICD-10-CM

## 2016-06-13 DIAGNOSIS — R3129 Other microscopic hematuria: Secondary | ICD-10-CM

## 2016-06-13 NOTE — Telephone Encounter (Signed)
One option would be Depo-Lupron suppression until she decides to proceed with hysterectomy. Alternative would be to try to take iron twice daily and as long as she can tolerate it from a GI standpoint then continue on it twice daily.

## 2016-06-13 NOTE — Telephone Encounter (Signed)
-----   Message from Anastasio Auerbach, MD sent at 05/16/2016  9:24 AM EDT ----- Tell patient her hemoglobin is low consistent with her bleeding. Recommend OTC iron daily. Recheck hemoglobin in 3 months

## 2016-06-13 NOTE — Telephone Encounter (Signed)
Patient said she is already taking a daily iron supplement and a multi vitamin  every day.  What to rec?

## 2016-06-15 NOTE — Telephone Encounter (Signed)
I spoke with patient. She does want to get the Lupron. I will start the process.  Also, will try doubling up on her iron supplement.  I will send order to you to sign.

## 2016-09-02 ENCOUNTER — Other Ambulatory Visit: Payer: Self-pay | Admitting: Allergy and Immunology

## 2016-11-18 DIAGNOSIS — M25562 Pain in left knee: Secondary | ICD-10-CM | POA: Diagnosis not present

## 2016-11-23 DIAGNOSIS — S8992XA Unspecified injury of left lower leg, initial encounter: Secondary | ICD-10-CM | POA: Diagnosis not present

## 2016-11-23 DIAGNOSIS — M1712 Unilateral primary osteoarthritis, left knee: Secondary | ICD-10-CM | POA: Diagnosis not present

## 2016-11-23 DIAGNOSIS — M25562 Pain in left knee: Secondary | ICD-10-CM | POA: Diagnosis not present

## 2017-02-15 DIAGNOSIS — R7309 Other abnormal glucose: Secondary | ICD-10-CM | POA: Diagnosis not present

## 2017-02-15 DIAGNOSIS — Z6839 Body mass index (BMI) 39.0-39.9, adult: Secondary | ICD-10-CM | POA: Diagnosis not present

## 2017-02-15 DIAGNOSIS — Z13220 Encounter for screening for lipoid disorders: Secondary | ICD-10-CM | POA: Diagnosis not present

## 2017-02-15 DIAGNOSIS — I10 Essential (primary) hypertension: Secondary | ICD-10-CM | POA: Diagnosis not present

## 2017-02-15 DIAGNOSIS — D649 Anemia, unspecified: Secondary | ICD-10-CM | POA: Diagnosis not present

## 2017-06-28 ENCOUNTER — Other Ambulatory Visit: Payer: Self-pay | Admitting: Gynecology

## 2017-06-28 DIAGNOSIS — Z1231 Encounter for screening mammogram for malignant neoplasm of breast: Secondary | ICD-10-CM

## 2017-07-10 ENCOUNTER — Ambulatory Visit: Payer: BLUE CROSS/BLUE SHIELD

## 2017-07-10 ENCOUNTER — Ambulatory Visit: Payer: Self-pay

## 2017-07-23 ENCOUNTER — Ambulatory Visit
Admission: RE | Admit: 2017-07-23 | Discharge: 2017-07-23 | Disposition: A | Discharge status: Home / Self Care | Payer: Self-pay | Source: Ambulatory Visit | Attending: Gynecology | Admitting: Gynecology

## 2017-07-23 DIAGNOSIS — Z1231 Encounter for screening mammogram for malignant neoplasm of breast: Secondary | ICD-10-CM

## 2017-08-01 ENCOUNTER — Other Ambulatory Visit: Payer: Self-pay

## 2017-08-01 MED ORDER — VALACYCLOVIR HCL 500 MG PO TABS: 500.0000 mg | ORAL_TABLET | Freq: Every day | ORAL | 0 refills | Status: DC

## 2017-08-01 NOTE — Telephone Encounter (Signed)
Overdue for CE.  Is scheduled with you for 09/07/17.

## 2017-08-06 ENCOUNTER — Telehealth: Payer: Self-pay

## 2017-08-06 ENCOUNTER — Other Ambulatory Visit: Payer: Self-pay | Admitting: Gynecology

## 2017-08-06 MED ORDER — VALACYCLOVIR HCL 500 MG PO TABS: 500.0000 mg | ORAL_TABLET | Freq: Every day | ORAL | 0 refills | Status: DC

## 2017-08-06 NOTE — Telephone Encounter (Signed)
Patient had called last week for refill on Valtrex and Dr. Loetta Rough had okayed it. Patient had changed pharmacy to [facility] in [place] but in error I sent it to [facility]. Patient called today because it was not at her pharmacy  I resent it today to her correct pharmacy.

## 2017-08-15 ENCOUNTER — Encounter: Payer: No Typology Code available for payment source | Admitting: Gynecology

## 2017-09-07 ENCOUNTER — Encounter: Payer: Self-pay | Admitting: Gynecology

## 2017-09-07 ENCOUNTER — Ambulatory Visit (INDEPENDENT_AMBULATORY_CARE_PROVIDER_SITE_OTHER): Payer: BLUE CROSS/BLUE SHIELD | Admitting: Gynecology

## 2017-09-07 VITALS — BP 138/86 | Ht 63.0 in | Wt 229.0 lb

## 2017-09-07 DIAGNOSIS — Z23 Encounter for immunization: Secondary | ICD-10-CM | POA: Diagnosis not present

## 2017-09-07 DIAGNOSIS — N920 Excessive and frequent menstruation with regular cycle: Secondary | ICD-10-CM

## 2017-09-07 DIAGNOSIS — A609 Anogenital herpesviral infection, unspecified: Secondary | ICD-10-CM | POA: Diagnosis not present

## 2017-09-07 DIAGNOSIS — D259 Leiomyoma of uterus, unspecified: Secondary | ICD-10-CM

## 2017-09-07 DIAGNOSIS — Z01411 Encounter for gynecological examination (general) (routine) with abnormal findings: Secondary | ICD-10-CM

## 2017-09-07 DIAGNOSIS — Z13220 Encounter for screening for lipoid disorders: Secondary | ICD-10-CM

## 2017-09-07 LAB — CBC WITH DIFFERENTIAL/PLATELET
BASOS PCT: 0.4 %
Basophils Absolute: 45 cells/uL (ref 0–200)
EOS ABS: 314 {cells}/uL (ref 15–500)
Eosinophils Relative: 2.8 %
HEMATOCRIT: 33.7 % — AB (ref 35.0–45.0)
Hemoglobin: 10.6 g/dL — ABNORMAL LOW (ref 11.7–15.5)
Lymphs Abs: 3270 cells/uL (ref 850–3900)
MCH: 24.4 pg — AB (ref 27.0–33.0)
MCHC: 31.5 g/dL — ABNORMAL LOW (ref 32.0–36.0)
MCV: 77.5 fL — AB (ref 80.0–100.0)
MPV: 11.2 fL (ref 7.5–12.5)
Monocytes Relative: 9.5 %
NEUTROS PCT: 58.1 %
Neutro Abs: 6507 cells/uL (ref 1500–7800)
PLATELETS: 326 10*3/uL (ref 140–400)
RBC: 4.35 10*6/uL (ref 3.80–5.10)
RDW: 14.1 % (ref 11.0–15.0)
TOTAL LYMPHOCYTE: 29.2 %
WBC: 11.2 10*3/uL — ABNORMAL HIGH (ref 3.8–10.8)
WBCMIX: 1064 {cells}/uL — AB (ref 200–950)

## 2017-09-07 LAB — LIPID PANEL
CHOLESTEROL: 158 mg/dL (ref <200)
HDL: 38 mg/dL — AB (ref >50)
LDL Cholesterol (Calc): 94 mg/dL (calc)
Non-HDL Cholesterol (Calc): 120 mg/dL (calc) (ref <130)
Total CHOL/HDL Ratio: 4.2 (calc) (ref <5.0)
Triglycerides: 156 mg/dL — ABNORMAL HIGH (ref <150)

## 2017-09-07 LAB — COMPREHENSIVE METABOLIC PANEL
AG Ratio: 1.6 (calc) (ref 1.0–2.5)
ALKALINE PHOSPHATASE (APISO): 52 U/L (ref 33–115)
ALT: 18 U/L (ref 6–29)
AST: 17 U/L (ref 10–35)
Albumin: 4.5 g/dL (ref 3.6–5.1)
BILIRUBIN TOTAL: 0.4 mg/dL (ref 0.2–1.2)
BUN: 9 mg/dL (ref 7–25)
CALCIUM: 9.9 mg/dL (ref 8.6–10.2)
CHLORIDE: 102 mmol/L (ref 98–110)
CO2: 26 mmol/L (ref 20–32)
CREATININE: 0.69 mg/dL (ref 0.50–1.10)
GLOBULIN: 2.8 g/dL (ref 1.9–3.7)
Glucose, Bld: 76 mg/dL (ref 65–99)
Potassium: 3.9 mmol/L (ref 3.5–5.3)
Sodium: 136 mmol/L (ref 135–146)
Total Protein: 7.3 g/dL (ref 6.1–8.1)

## 2017-09-07 MED ORDER — VALACYCLOVIR HCL 500 MG PO TABS: 500.0000 mg | ORAL_TABLET | Freq: Every day | ORAL | 12 refills | Status: DC

## 2017-09-07 NOTE — Progress Notes (Signed)
    Jessica Henry December 01, 1965 [id number]        51 y.o.  [id number] for annual gynecologic exam.  History of large leiomyoma.  Menses remain heavy on a monthly basis.  No bleeding in between.  Was planning for hysterectomy but canceled due to loss of insurance.  Was planning for Depo-Lupron but there was some issue with following up for this and she never did.  Wants to proceed with Depo-Lupron now.  Past medical history,surgical history, problem list, medications, allergies, family history and social history were all reviewed and documented as reviewed in the EPIC chart.  ROS:  Performed with pertinent positives and negatives included in the history, assessment and plan.   Additional significant findings : None   Exam: [name] assistant Vitals:   09/07/17 1521  BP: 138/86  Weight: 229 lb (103.9 kg)  Height: 5\' 3"  (1.6 m)   Body mass index is 40.57 kg/m.  General appearance:  Normal affect, orientation and appearance. Skin: Grossly normal HEENT: Without gross lesions.  No cervical or supraclavicular adenopathy. Thyroid normal.  Lungs:  Clear without wheezing, rales or rhonchi Cardiac: RR, without RMG Abdominal:  Soft, nontender, with palpable mass to the level of the umbilicus consistent with her history of myomas.  No guarding, rebound, organomegaly Breasts:  Examined lying and sitting without masses, retractions, discharge or axillary adenopathy. Pelvic:  Ext, BUS, Vagina: Normal  Cervix: Normal  Uterus: To the level of the umbilicus.  Nontender.  Adnexa: Unable to evaluate secondary to the uterine enlargement.  Anus and perineum: Normal   Rectovaginal: Normal sphincter tone without palpated masses or tenderness.    Assessment/Plan:  51 y.o. [id number] female for annual gynecologic exam with regular menses, tubal sterilization.   1. Leiomyoma.  Her uterus continues large.  I again reviewed options with her to include observation, myomectomy, hysterectomy, hormonal suppression  of her menorrhagia.  Underwent sonohysterogram in the past with negative endometrial biopsy 2014.  Recommend baseline ultrasound now to make sure she has not developing ovarian process in the interim.  She wants to go ahead with Depo-Lupron now for menstrual suppression.  Has always been borderline anemic.  Will arrange for Depo-Lupron 11.25 mg.  She is planning on proceeding with surgery at her choice but does not want to schedule at this time.  She is borderline anemic and hopefully the Depo-Lupron will also help to improve her hemoglobin.  Risks of transfusion associated with hysterectomy for large myoma discussed understood and accepted. 2. Mammography 07/2017.  Continue with annual mammography when due.  SBE monthly reviewed.  Breast exam normal today. 3. History of HSV.  Using Valtrex 500 mg daily for suppression.  Refill times 1 year provided. 4. Pap smear/HPV 2016.  No Pap smear done today.  No history of abnormal Pap smears.  Plan repeat Pap smear at 5-year interval per current screening guidelines. 5. Health maintenance.  Patient requests baseline labs.  CBC, CMP, lipid profile ordered.  Follow-up for Depo-Lupron and her ultrasound.  Follow-up in 1 year for annual exam.   Anastasio Auerbach MD, 3:45 PM 09/07/2017

## 2017-09-07 NOTE — Patient Instructions (Addendum)
Follow-up for ultrasound as scheduled Office will call you to arrange for the Depo-Lupron shot.

## 2017-10-10 ENCOUNTER — Other Ambulatory Visit: Payer: Self-pay | Admitting: Gynecology

## 2017-10-10 ENCOUNTER — Encounter: Payer: Self-pay | Admitting: Gynecology

## 2017-10-10 ENCOUNTER — Ambulatory Visit (INDEPENDENT_AMBULATORY_CARE_PROVIDER_SITE_OTHER): Payer: BLUE CROSS/BLUE SHIELD

## 2017-10-10 ENCOUNTER — Ambulatory Visit (INDEPENDENT_AMBULATORY_CARE_PROVIDER_SITE_OTHER): Payer: BLUE CROSS/BLUE SHIELD | Admitting: Gynecology

## 2017-10-10 VITALS — BP 130/80

## 2017-10-10 DIAGNOSIS — D251 Intramural leiomyoma of uterus: Secondary | ICD-10-CM

## 2017-10-10 DIAGNOSIS — N852 Hypertrophy of uterus: Secondary | ICD-10-CM

## 2017-10-10 DIAGNOSIS — N920 Excessive and frequent menstruation with regular cycle: Secondary | ICD-10-CM | POA: Diagnosis not present

## 2017-10-10 DIAGNOSIS — D259 Leiomyoma of uterus, unspecified: Secondary | ICD-10-CM

## 2017-10-10 NOTE — Progress Notes (Signed)
    KAMREE WIENS 1966/07/08 [id number]        52 y.o.  [id number] presents for ultrasound.  History of leiomyoma with heavy menses.  Is planning on using Depo-Lupron for menstrual suppression, leiomyoma regression and hopefully getting through menopause without surgery.  Ultrasound ordered today as a baseline.  Last ultrasound 2014 where myoma measured 10 cm.    Past medical history,surgical history, problem list, medications, allergies, family history and social history were all reviewed and documented in the EPIC chart.  Directed ROS with pertinent positives and negatives documented in the history of present illness/assessment and plan.  Exam: Vitals:   10/10/17 1505  BP: 130/80   General appearance:  Normal  Ultrasound transvaginal and transabdominal shows uterus enlarged with myoma measuring 11 cm.  Secondary myoma measuring 51 mm.  Endometrial echo 9.5 mm.  Right and left ovaries visualized and normal.  Nabothian cyst noted at cervix 29 x 20 mm.  Cul-de-sac negative.  Assessment/Plan:  52 y.o. [id number] with large myoma at 11 cm and smaller myoma at 5 cm.  Heavier menses associated with this.  I again reviewed options to include surgical versus nonsurgical.  Patient wants to proceed with Depo-Lupron and she is in the process of arranging this with our office staff.  We will plan on 1 year of treatment and then stopping and seeing how she does after that.    Anastasio Auerbach MD, 3:18 PM 10/10/2017

## 2017-10-10 NOTE — Patient Instructions (Signed)
Office will contact you in regards to arranging for the Depo-Lupron.

## 2017-10-23 ENCOUNTER — Telehealth: Payer: Self-pay

## 2017-10-23 NOTE — Telephone Encounter (Signed)
I called specialty pharmacy [facility] to check status of Lupron. They said Prior Auth with [employer] is pending. I have sent everything [employer] has asked for with last fax sent on 10/12/17.  Initial PA request faxed 09/28/17.    I faxed over a my initial PA form with records to [facility] and on cover asked them to assist me with status of this PA.

## 2017-10-26 ENCOUNTER — Telehealth: Payer: Self-pay

## 2017-10-26 NOTE — Telephone Encounter (Signed)
I called [facility] [phone] to let them know that [employer] called with authorization for Lupron 11.25mg . Auth#[id number] valid 09/25/18-03/28/18.  The pharmacy ran benefits and said it leaves patient with a $2000 copyment. The lady told me to have the patient call them to discuss copymt assistance that might be available to her.  I called patient and spoke with her and informed her of all this. I provided the phone number and she will call and discuss with them.Marland Kitchen

## 2017-11-08 ENCOUNTER — Telehealth: Payer: Self-pay

## 2017-11-08 NOTE — Telephone Encounter (Signed)
I called patient and asked her to call me to follow up on the status of what we discussed on our 10/26/17 conversation. I do not have permission to leave a detailed message.  (This is regarding the Lupron. She was to call pharmacy back to see how they might help her with financial assistance with copay.)

## 2017-12-25 ENCOUNTER — Encounter: Payer: Self-pay | Admitting: Gynecology

## 2018-02-01 ENCOUNTER — Telehealth: Payer: Self-pay

## 2018-02-01 NOTE — Telephone Encounter (Signed)
Can try norethindrone acetate 5 mg daily for menstrual suppression

## 2018-02-01 NOTE — Telephone Encounter (Signed)
Patient called today to let you know that she is not going to be able to afford to use the Lupron. She asked if any other options?

## 2018-02-04 ENCOUNTER — Other Ambulatory Visit: Payer: Self-pay | Admitting: Gynecology

## 2018-02-04 NOTE — Telephone Encounter (Signed)
Tried to reach patient but her voice mail has not been activated.

## 2018-02-04 NOTE — Telephone Encounter (Signed)
#  30 with 12 refills

## 2018-02-04 NOTE — Telephone Encounter (Signed)
Quantity to prescribe? Refills?

## 2018-02-28 DIAGNOSIS — I10 Essential (primary) hypertension: Secondary | ICD-10-CM | POA: Diagnosis not present

## 2018-02-28 DIAGNOSIS — Z1331 Encounter for screening for depression: Secondary | ICD-10-CM | POA: Diagnosis not present

## 2018-02-28 DIAGNOSIS — Z6841 Body Mass Index (BMI) 40.0 and over, adult: Secondary | ICD-10-CM | POA: Diagnosis not present

## 2018-03-16 DIAGNOSIS — J302 Other seasonal allergic rhinitis: Secondary | ICD-10-CM | POA: Diagnosis not present

## 2018-07-29 ENCOUNTER — Other Ambulatory Visit: Payer: Self-pay | Admitting: Gynecology

## 2018-07-29 DIAGNOSIS — Z1231 Encounter for screening mammogram for malignant neoplasm of breast: Secondary | ICD-10-CM

## 2018-09-09 ENCOUNTER — Ambulatory Visit: Payer: BLUE CROSS/BLUE SHIELD

## 2018-09-20 ENCOUNTER — Ambulatory Visit
Admission: RE | Admit: 2018-09-20 | Discharge: 2018-09-20 | Disposition: A | Discharge status: Home / Self Care | Payer: BLUE CROSS/BLUE SHIELD | Source: Ambulatory Visit | Attending: Gynecology | Admitting: Gynecology

## 2018-09-20 DIAGNOSIS — Z1231 Encounter for screening mammogram for malignant neoplasm of breast: Secondary | ICD-10-CM | POA: Diagnosis not present

## 2018-09-24 ENCOUNTER — Ambulatory Visit (INDEPENDENT_AMBULATORY_CARE_PROVIDER_SITE_OTHER): Payer: BLUE CROSS/BLUE SHIELD | Admitting: Gynecology

## 2018-09-24 ENCOUNTER — Encounter: Payer: Self-pay | Admitting: Gynecology

## 2018-09-24 VITALS — BP 124/82 | Ht 63.5 in | Wt 228.0 lb

## 2018-09-24 DIAGNOSIS — D259 Leiomyoma of uterus, unspecified: Secondary | ICD-10-CM

## 2018-09-24 DIAGNOSIS — Z01419 Encounter for gynecological examination (general) (routine) without abnormal findings: Secondary | ICD-10-CM | POA: Diagnosis not present

## 2018-09-24 DIAGNOSIS — Z13220 Encounter for screening for lipoid disorders: Secondary | ICD-10-CM | POA: Diagnosis not present

## 2018-09-24 DIAGNOSIS — Z23 Encounter for immunization: Secondary | ICD-10-CM

## 2018-09-24 DIAGNOSIS — N920 Excessive and frequent menstruation with regular cycle: Secondary | ICD-10-CM

## 2018-09-24 MED ORDER — NORETHINDRONE ACETATE 5 MG PO TABS: 5.0000 mg | ORAL_TABLET | Freq: Every day | ORAL | 11 refills | Status: DC

## 2018-09-24 MED ORDER — VALACYCLOVIR HCL 500 MG PO TABS: 500.0000 mg | ORAL_TABLET | Freq: Every day | ORAL | 12 refills | Status: DC

## 2018-09-24 NOTE — Patient Instructions (Signed)
Start on the Aygestin medication daily.  Call in 3 months in follow-up to see how you are doing.  Follow-up in 1 year for annual exam.  Call if you want to proceed with hysterectomy.

## 2018-09-24 NOTE — Progress Notes (Signed)
    Jessica Henry 05-Sep-1966 [id number]        52 y.o.  [id number] for annual gynecologic exam.  Continues with heavy menses.  History of significant leiomyoma.  Was to start on Depo-Lupron but was too expensive.  Past medical history,surgical history, problem list, medications, allergies, family history and social history were all reviewed and documented as reviewed in the EPIC chart.  ROS:  Performed with pertinent positives and negatives included in the history, assessment and plan.   Additional significant findings : None   Exam: [name] assistant Vitals:   09/24/18 1534  BP: 124/82  Weight: 228 lb (103.4 kg)  Height: 5' 3.5" (1.613 m)   Body mass index is 39.75 kg/m.  General appearance:  Normal affect, orientation and appearance. Skin: Grossly normal HEENT: Without gross lesions.  No cervical or supraclavicular adenopathy. Thyroid normal.  Lungs:  Clear without wheezing, rales or rhonchi Cardiac: RR, without RMG Abdominal:  Soft, nontender, with uterus palpable at the umbilicus. Breasts:  Examined lying and sitting without masses, retractions, discharge or axillary adenopathy. Pelvic:  Ext, BUS, Vagina: Normal  Cervix: Normal  Uterus: To the level of the umbilicus filling the pelvis.  Adnexa: Unable to evaluate due to the uterine bulk  Anus and perineum: Normal   Rectovaginal: Normal sphincter tone without palpated masses or tenderness.    Assessment/Plan:  52 y.o. [id number] female for annual gynecologic exam with regular menses, tubal sterilization.   1. With leiomyoma to the umbilicus.  Menses remain heavy.  Has always been borderline anemic.  Was to start Depo-Lupron but was too expensive.  Has been counseled many times as far as proceeding with hysterectomy but patient does not want to do that at this time.  Recent ultrasound earlier this year confirms 1 large myoma and a smaller myoma.  Both ovaries visualized and normal.  At this point recommended trial of Aygestin  5 mg for menstrual suppression to see if this does not help.  I again encouraged her to consider hysterectomy.  Check FSH now to see where we stand as far as beginnings of menopause. 2. Pap smear/HPV 2016.  No Pap smear done today.  No history of abnormal Pap smears.  And repeat Pap smear/HPV at 5-year interval per current screening guidelines. 3. Mammography last week.  Breast exam normal today. 4. Colonoscopy not yet.  Patient will move towards scheduling as she has turned 52. 5. History of HSV.  Uses Valtrex 500 mg daily for suppression. 6. Health maintenance.  Baseline CBC, CMP, lipid profile ordered along with her [facility]  Follow-up 1 year, sooner as needed.   Anastasio Auerbach MD, 4:07 PM 09/24/2018

## 2018-09-25 LAB — LIPID PANEL
CHOLESTEROL: 167 mg/dL (ref <200)
HDL: 37 mg/dL — AB (ref >50)
LDL Cholesterol (Calc): 105 mg/dL (calc) — ABNORMAL HIGH
Non-HDL Cholesterol (Calc): 130 mg/dL (calc) — ABNORMAL HIGH (ref <130)
Total CHOL/HDL Ratio: 4.5 (calc) (ref <5.0)
Triglycerides: 135 mg/dL (ref <150)

## 2018-09-25 LAB — CBC WITH DIFFERENTIAL/PLATELET
ABSOLUTE MONOCYTES: 833 {cells}/uL (ref 200–950)
Basophils Absolute: 49 cells/uL (ref 0–200)
Basophils Relative: 0.5 %
EOS PCT: 4.9 %
Eosinophils Absolute: 480 cells/uL (ref 15–500)
HCT: 32.6 % — ABNORMAL LOW (ref 35.0–45.0)
HEMOGLOBIN: 10.4 g/dL — AB (ref 11.7–15.5)
LYMPHS ABS: 2891 {cells}/uL (ref 850–3900)
MCH: 24.8 pg — ABNORMAL LOW (ref 27.0–33.0)
MCHC: 31.9 g/dL — ABNORMAL LOW (ref 32.0–36.0)
MCV: 77.8 fL — AB (ref 80.0–100.0)
MPV: 10.6 fL (ref 7.5–12.5)
Monocytes Relative: 8.5 %
NEUTROS ABS: 5547 {cells}/uL (ref 1500–7800)
NEUTROS PCT: 56.6 %
Platelets: 380 10*3/uL (ref 140–400)
RBC: 4.19 10*6/uL (ref 3.80–5.10)
RDW: 14 % (ref 11.0–15.0)
Total Lymphocyte: 29.5 %
WBC: 9.8 10*3/uL (ref 3.8–10.8)

## 2018-09-25 LAB — COMPREHENSIVE METABOLIC PANEL
AG RATIO: 1.6 (calc) (ref 1.0–2.5)
ALKALINE PHOSPHATASE (APISO): 54 U/L (ref 33–130)
ALT: 20 U/L (ref 6–29)
AST: 24 U/L (ref 10–35)
Albumin: 4.4 g/dL (ref 3.6–5.1)
BILIRUBIN TOTAL: 0.2 mg/dL (ref 0.2–1.2)
BUN: 10 mg/dL (ref 7–25)
CALCIUM: 9.3 mg/dL (ref 8.6–10.4)
CO2: 25 mmol/L (ref 20–32)
Chloride: 104 mmol/L (ref 98–110)
Creat: 0.72 mg/dL (ref 0.50–1.05)
Globulin: 2.7 g/dL (calc) (ref 1.9–3.7)
Glucose, Bld: 88 mg/dL (ref 65–99)
Potassium: 3.8 mmol/L (ref 3.5–5.3)
SODIUM: 138 mmol/L (ref 135–146)
TOTAL PROTEIN: 7.1 g/dL (ref 6.1–8.1)

## 2018-09-25 LAB — FOLLICLE STIMULATING HORMONE: FSH: 10.6 m[IU]/mL

## 2019-02-10 ENCOUNTER — Telehealth: Payer: Self-pay

## 2019-02-10 NOTE — Telephone Encounter (Signed)
Patient having pelvic pain with menses that feels like contractions. Today is last day of her period and still feeling the pain. I offered office visit for tomorrow.  She asked about Weds. And scheduled then. She saying she is taking Ibuprofen and that helps. She said she thinks she will be fine until Weds.

## 2019-02-11 ENCOUNTER — Other Ambulatory Visit: Payer: Self-pay

## 2019-02-12 ENCOUNTER — Encounter: Payer: Self-pay | Admitting: Gynecology

## 2019-02-12 ENCOUNTER — Ambulatory Visit (INDEPENDENT_AMBULATORY_CARE_PROVIDER_SITE_OTHER): Payer: BLUE CROSS/BLUE SHIELD | Admitting: Gynecology

## 2019-02-12 VITALS — BP 124/80

## 2019-02-12 DIAGNOSIS — D250 Submucous leiomyoma of uterus: Secondary | ICD-10-CM | POA: Diagnosis not present

## 2019-02-12 DIAGNOSIS — N920 Excessive and frequent menstruation with regular cycle: Secondary | ICD-10-CM | POA: Diagnosis not present

## 2019-02-12 DIAGNOSIS — D252 Subserosal leiomyoma of uterus: Secondary | ICD-10-CM | POA: Diagnosis not present

## 2019-02-12 DIAGNOSIS — R102 Pelvic and perineal pain: Secondary | ICD-10-CM

## 2019-02-12 DIAGNOSIS — D251 Intramural leiomyoma of uterus: Secondary | ICD-10-CM

## 2019-02-12 NOTE — Progress Notes (Signed)
    Jessica Henry 22-Jun-1966 [id number]        53 y.o.  [id number] with history of significant leiomyoma and heavy menses.  Uterus palpates to the level of the umbilicus.  Had been counseled multiple times with recommendations to proceed with hysterectomy.  Patient reluctant to do so.  Was to start Depo-Lupron but it was too expensive.  Was seen 09/2018 and was to start a trial of Aygestin 5 mg daily but has not started it because she read the side effect profile and was afraid to start it.  Her most recent menses started on time but was associated with more pain feeling like pulling of her C-section scar.  She took ibuprofen which has made the pain better but she wanted to make sure nothing else was going on.  Past medical history,surgical history, problem list, medications, allergies, family history and social history were all reviewed and documented in the EPIC chart.  Directed ROS with pertinent positives and negatives documented in the history of present illness/assessment and plan.  Exam: Jessica Henry assistant Vitals:   02/12/19 1316  BP: 124/80   General appearance:  Normal Abdomen with uterus palpable below umbilicus.  No acute changes such as guarding rebound or evidence of hernia. Pelvic external BUS vagina normal.  Cervix normal.  Uterus to the level of the umbilicus filling the pelvis.  Adnexa unable to evaluate.  No significant pain on bimanual exam.  Assessment/Plan:  53 y.o. [id number] with large uterus secondary to leiomyoma.  Borderline anemia on iron.  More pain this last menses which responded to ibuprofen.  Ultrasound 10/2017 showed her leiomyoma and both ovaries appeared normal.  Given the inability to do a good pelvic exam I recommended ultrasound for ovarian clearance to make sure her pain was not secondary to an ovarian process and she agrees to schedule.  I want to also recheck her CBC to make sure she is not becoming too anemic and the urine analysis to make sure her pain is not  attributable to the bladder.  We again discussed long-term management and my recommendation for hysterectomy for which she is still reluctant.  She does want to go ahead and start the Aygestin and see how she does with this and she will go ahead and start this as she has a prescription at home.    Anastasio Auerbach MD, 1:33 PM 02/12/2019

## 2019-02-12 NOTE — Patient Instructions (Signed)
Follow-up for the ultrasound as scheduled. 

## 2019-02-13 LAB — CBC WITH DIFFERENTIAL/PLATELET
Absolute Monocytes: 1589 cells/uL — ABNORMAL HIGH (ref 200–950)
Basophils Absolute: 51 cells/uL (ref 0–200)
Basophils Relative: 0.3 %
Eosinophils Absolute: 321 cells/uL (ref 15–500)
Eosinophils Relative: 1.9 %
HCT: 29.1 % — ABNORMAL LOW (ref 35.0–45.0)
Hemoglobin: 9 g/dL — ABNORMAL LOW (ref 11.7–15.5)
Lymphs Abs: 3127 cells/uL (ref 850–3900)
MCH: 23.6 pg — ABNORMAL LOW (ref 27.0–33.0)
MCHC: 30.9 g/dL — ABNORMAL LOW (ref 32.0–36.0)
MCV: 76.4 fL — ABNORMAL LOW (ref 80.0–100.0)
MPV: 10 fL (ref 7.5–12.5)
Monocytes Relative: 9.4 %
Neutro Abs: 11813 cells/uL — ABNORMAL HIGH (ref 1500–7800)
Neutrophils Relative %: 69.9 %
Platelets: 495 10*3/uL — ABNORMAL HIGH (ref 140–400)
RBC: 3.81 10*6/uL (ref 3.80–5.10)
RDW: 14 % (ref 11.0–15.0)
Total Lymphocyte: 18.5 %
WBC: 16.9 10*3/uL — ABNORMAL HIGH (ref 3.8–10.8)

## 2019-02-14 LAB — URINALYSIS, COMPLETE W/RFL CULTURE
Bilirubin Urine: NEGATIVE
Glucose, UA: NEGATIVE
Hyaline Cast: NONE SEEN /LPF
Ketones, ur: NEGATIVE
Leukocyte Esterase: NEGATIVE
Nitrites, Initial: NEGATIVE
Specific Gravity, Urine: 1.028 (ref 1.001–1.03)
pH: 6 (ref 5.0–8.0)

## 2019-02-14 LAB — URINE CULTURE
MICRO NUMBER:: 453598
SPECIMEN QUALITY:: ADEQUATE

## 2019-02-14 LAB — CULTURE INDICATED

## 2019-02-18 ENCOUNTER — Other Ambulatory Visit: Payer: Self-pay

## 2019-02-20 ENCOUNTER — Ambulatory Visit (INDEPENDENT_AMBULATORY_CARE_PROVIDER_SITE_OTHER): Payer: BLUE CROSS/BLUE SHIELD

## 2019-02-20 ENCOUNTER — Ambulatory Visit (INDEPENDENT_AMBULATORY_CARE_PROVIDER_SITE_OTHER): Payer: BLUE CROSS/BLUE SHIELD | Admitting: Gynecology

## 2019-02-20 ENCOUNTER — Encounter: Payer: Self-pay | Admitting: Gynecology

## 2019-02-20 ENCOUNTER — Other Ambulatory Visit: Payer: Self-pay

## 2019-02-20 VITALS — BP 122/78

## 2019-02-20 DIAGNOSIS — D252 Subserosal leiomyoma of uterus: Secondary | ICD-10-CM

## 2019-02-20 DIAGNOSIS — N921 Excessive and frequent menstruation with irregular cycle: Secondary | ICD-10-CM

## 2019-02-20 DIAGNOSIS — D250 Submucous leiomyoma of uterus: Secondary | ICD-10-CM

## 2019-02-20 DIAGNOSIS — D251 Intramural leiomyoma of uterus: Secondary | ICD-10-CM | POA: Diagnosis not present

## 2019-02-20 DIAGNOSIS — D500 Iron deficiency anemia secondary to blood loss (chronic): Secondary | ICD-10-CM

## 2019-02-20 NOTE — Progress Notes (Signed)
    Jessica Henry July 23, 1966 [id number]        53 y.o.  [id number] presents for ultrasound.  History of significant leiomyoma and heavy menses.  Uterus palpates at the umbilicus.  Patient does not want to proceed with surgery and the ultrasound was ordered to rule out ovarian pathology as we are continuing to follow her expectantly.  Recent hemoglobin 9.0.  Has run in the 10 range previously.  Past medical history,surgical history, problem list, medications, allergies, family history and social history were all reviewed and documented in the EPIC chart.  Directed ROS with pertinent positives and negatives documented in the history of present illness/assessment and plan.  Exam: Vitals:   02/20/19 1424  BP: 122/78   General appearance:  Normal  Ultrasound transabdominal shows uterus markedly enlarged with multiple myomas.  Endometrial echo distorted by the leiomyoma measuring 6.7 mm.  Right ovary small and atrophic in appearance.  Left ovary with 26 x 22 mm cystic area with debris consistent with physiologic change.  Cul-de-sac negative with no free fluid noted.  Assessment/Plan:  53 y.o. S3P5945 with significant leiomyoma.  Both ovaries visualized without significant pathology.  Patient prefers expectant management.  She started on Aygestin last week to hopefully lessen her menses.  Recent hemoglobin at 9.  She is started on iron daily and will continue on this.  She will follow-up if her menses continue heavy despite starting the Aygestin.    Anastasio Auerbach MD, 2:34 PM 02/20/2019

## 2019-02-20 NOTE — Patient Instructions (Signed)
Continue on the Aygestin as we discussed.  Follow-up if bleeding continues to be an issue

## 2019-04-14 ENCOUNTER — Telehealth: Payer: Self-pay | Admitting: *Deleted

## 2019-04-14 NOTE — Telephone Encounter (Signed)
Patient informed. 

## 2019-04-14 NOTE — Telephone Encounter (Signed)
Unable to leave message, voicemail is full.

## 2019-04-14 NOTE — Telephone Encounter (Signed)
Okay for 2 times several days

## 2019-04-14 NOTE — Telephone Encounter (Signed)
Patient currently takes Aygestin 5 mg tablet 1 po daily, has been doing well on Rx until 2 Thursday ago noticed the bleeding increased wearing tampon and pad and has to change at least 45 mins-1 hours due to flow. Patient said prior to recent heavy bleeding her flow was well, spotting and moderate flow if that while taking medication. Patient asked should she take 2 pills? Please advise

## 2019-04-22 ENCOUNTER — Telehealth: Payer: Self-pay

## 2019-04-22 NOTE — Telephone Encounter (Signed)
The Aygestin was ordered to hopefully lighten her menses.  She has large fibroids so it may not work.  Alternative would be to try Megace 20 mg daily.

## 2019-04-22 NOTE — Telephone Encounter (Signed)
Re: Aygestin. Is still doubling up per 04/14/19  phone call advice. Still has a flow like a period.Wearing a tampon and pad every day. Some days a little bit lighter.    2 days last week had blood clots. Was just wondering what Aygestin should be doing for her.

## 2019-04-23 MED ORDER — MEGESTROL ACETATE 20 MG PO TABS: 20.0000 mg | ORAL_TABLET | Freq: Every day | ORAL | 0 refills | Status: DC

## 2019-04-23 NOTE — Telephone Encounter (Signed)
Spoke with patient and informed her. Rx sent. 

## 2019-04-25 DIAGNOSIS — Z6841 Body Mass Index (BMI) 40.0 and over, adult: Secondary | ICD-10-CM | POA: Diagnosis not present

## 2019-04-25 DIAGNOSIS — J3089 Other allergic rhinitis: Secondary | ICD-10-CM | POA: Diagnosis not present

## 2019-04-25 DIAGNOSIS — I10 Essential (primary) hypertension: Secondary | ICD-10-CM | POA: Diagnosis not present

## 2019-05-08 ENCOUNTER — Telehealth: Payer: Self-pay

## 2019-05-08 NOTE — Telephone Encounter (Signed)
Patient called in voice mail asking me to call that she had "a few questions".  I called her cell # first but no voice mail is set up. I called her home # and voice mail is full.   Unable to reach her.

## 2019-05-09 NOTE — Telephone Encounter (Signed)
Patient called back and said the Aygestin 5 mg tablet was causing a rash on arms and leg. Was prescribed megace 20 on 04/23/19, but Rx was out of stock and her pharmacy so she was taking Aygestin to help with bleeding. Patient said the pharmacy will have the megace on Monday, I recommended she stop medication and start megace or check with pharmacy to see if they have in stock elsewhere. I also told patient she can take benadryl to help if itching. Patient verbalized she understood.

## 2019-06-05 IMAGING — MG DIGITAL SCREENING BILATERAL MAMMOGRAM WITH CAD
4 series · 4 of 4 positions shown · non-contrast
Comparison: Previous exam(s).

CLINICAL DATA: Screening.

EXAM:
DIGITAL SCREENING BILATERAL MAMMOGRAM WITH CAD

[L MLO]
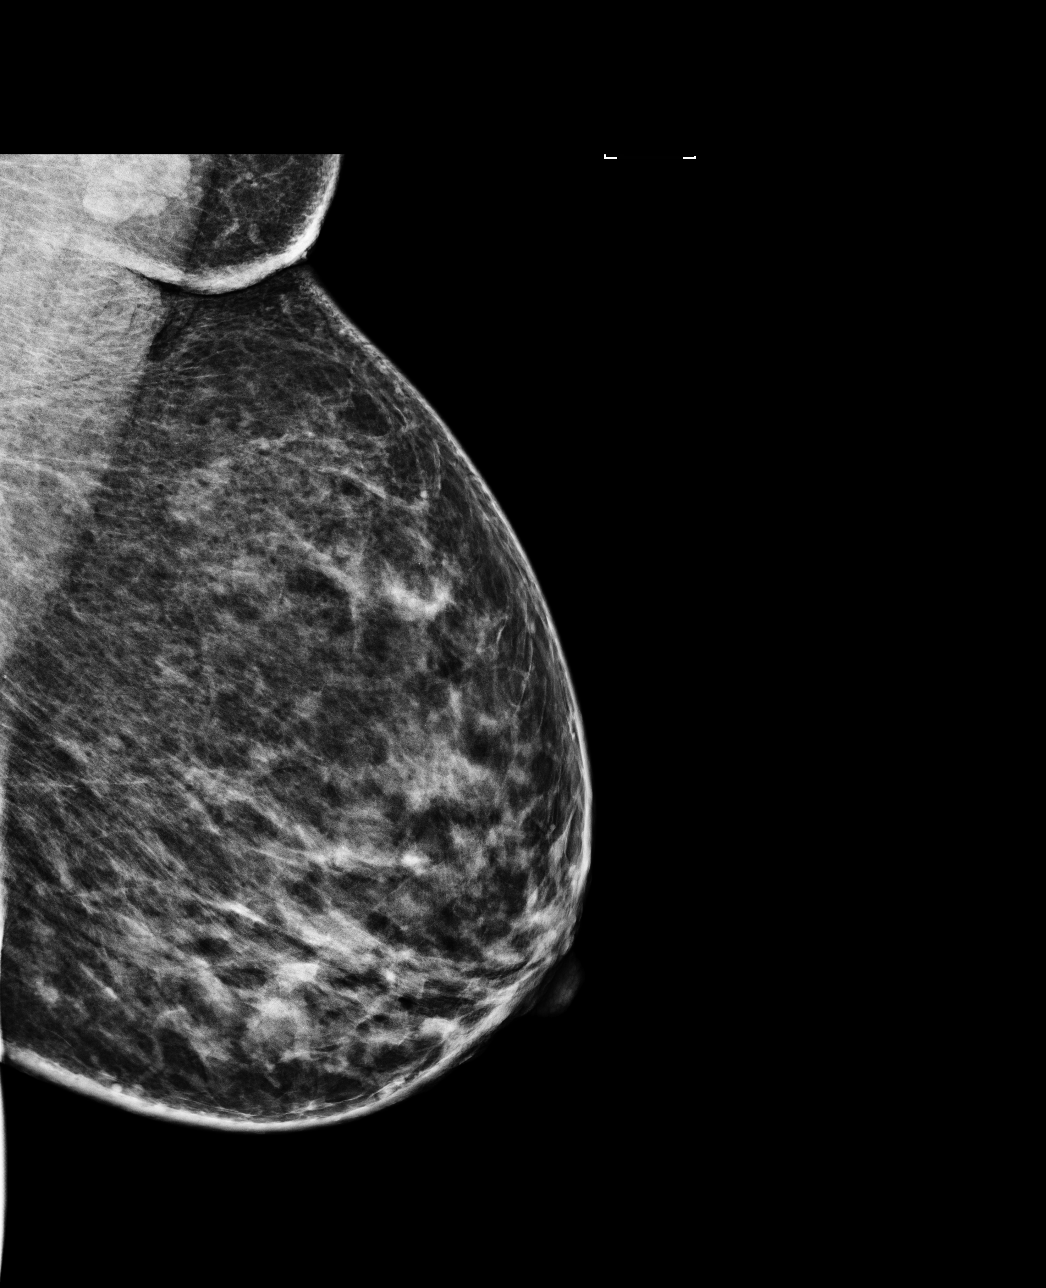

[R CC]
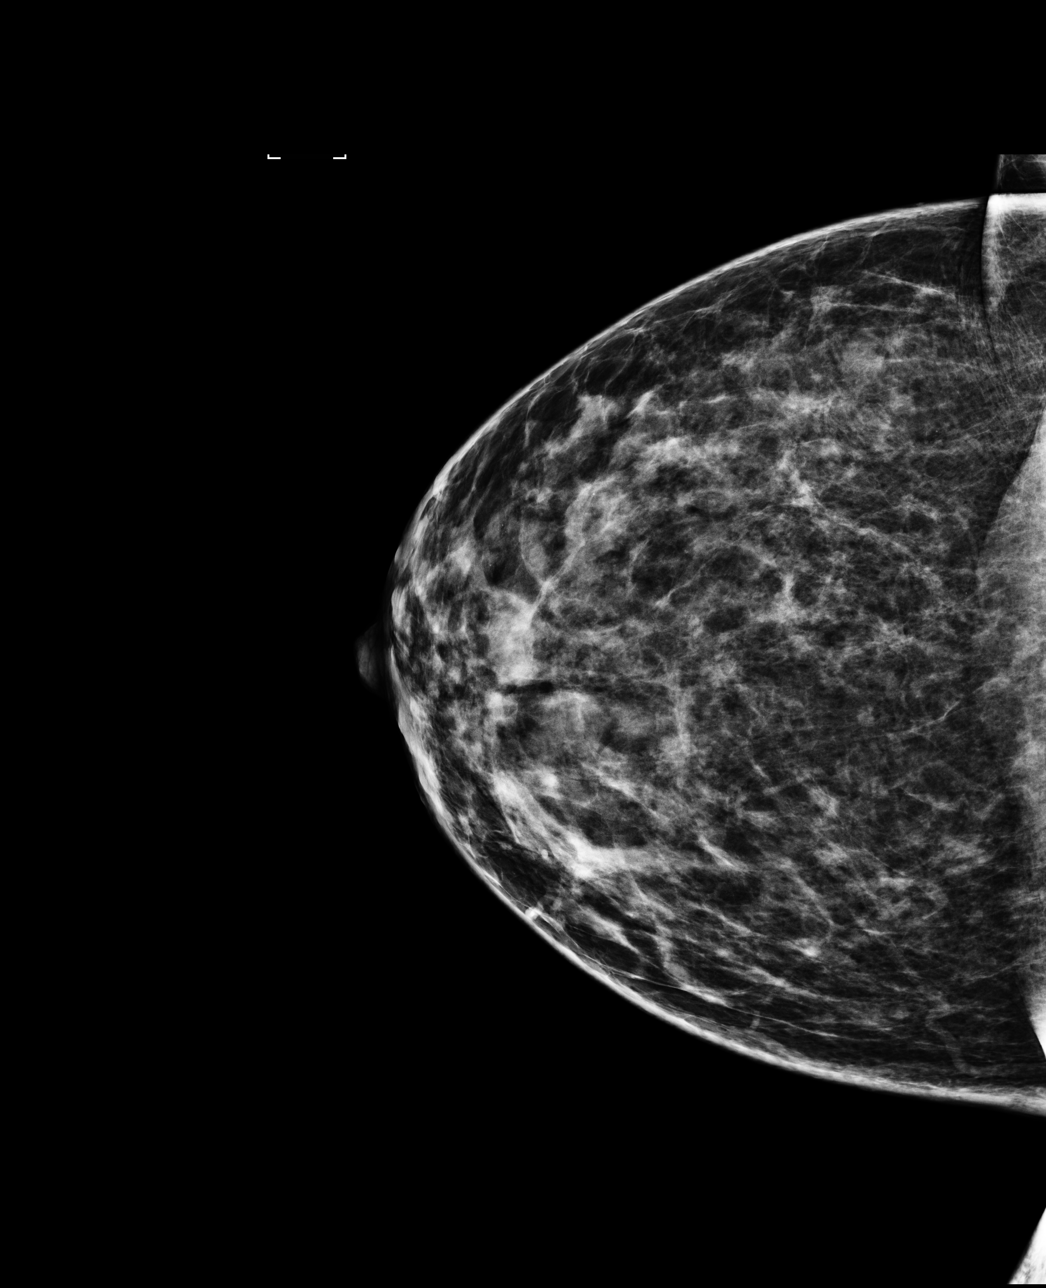

[R MLO]
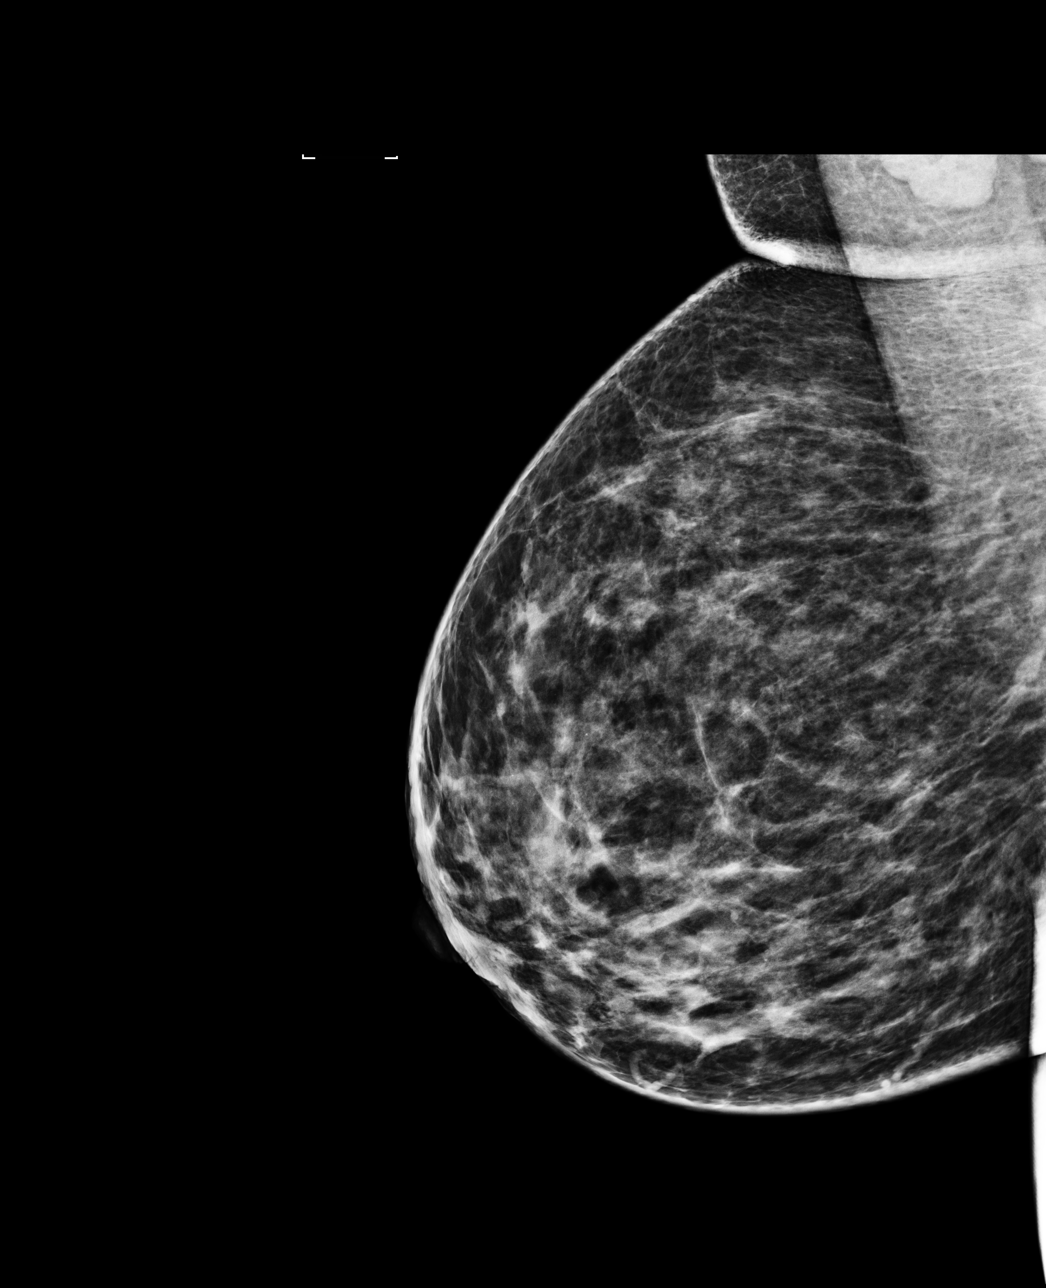

[L CC]
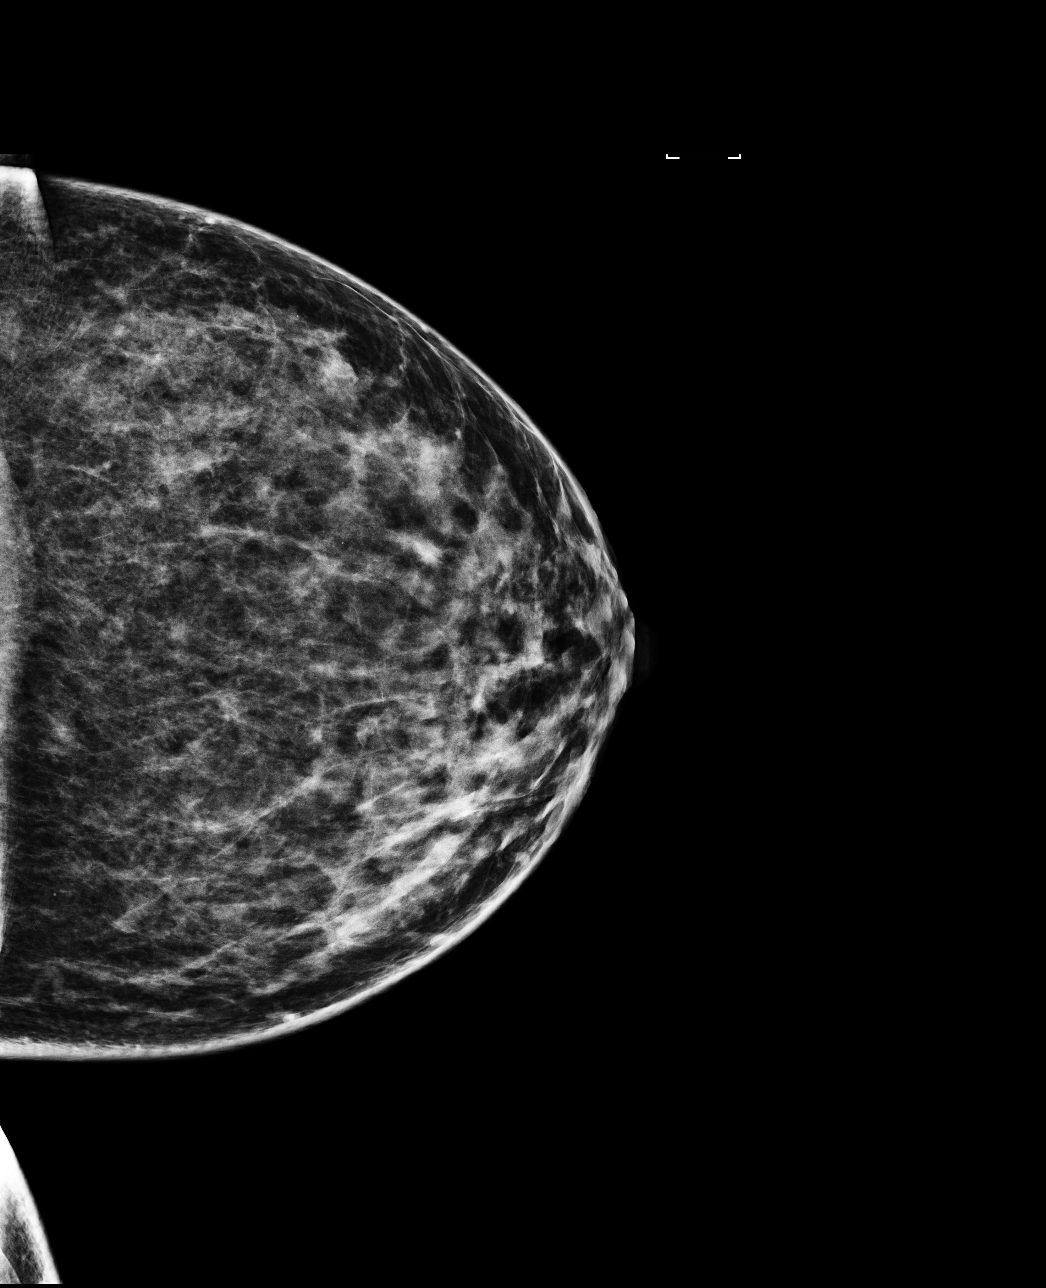

[4 of 4 positions shown; findings below may reference images not displayed]

ACR Breast Density Category c: The breast tissue is heterogeneously
dense, which may obscure small masses.
FINDINGS: There are no findings suspicious for malignancy. Images were
processed with CAD.
IMPRESSION: No mammographic evidence of malignancy. A result letter of this
screening mammogram will be mailed directly to the patient.

RECOMMENDATION:
Screening mammogram in one year. (Code:[id number])

BI-RADS CATEGORY  1: Negative.

## 2019-06-09 ENCOUNTER — Telehealth: Payer: Self-pay | Admitting: *Deleted

## 2019-06-09 MED ORDER — MEGESTROL ACETATE 20 MG PO TABS: 20.0000 mg | ORAL_TABLET | Freq: Every day | ORAL | 6 refills | Status: DC

## 2019-06-09 NOTE — Telephone Encounter (Signed)
Rx sent, unable to leave voicemail, voicemail if full.

## 2019-06-09 NOTE — Telephone Encounter (Signed)
Patient called requesting refill on megace 20 mg tablet, taking 1 po daily and it is helping with her bleeding, flow is not heavy as it was before. Please advise

## 2019-06-09 NOTE — Telephone Encounter (Signed)
Okay for Megace 20 mg #30 with 6 refills

## 2019-07-02 ENCOUNTER — Encounter: Payer: Self-pay | Admitting: Gynecology

## 2019-08-22 ENCOUNTER — Other Ambulatory Visit: Payer: Self-pay

## 2019-08-25 ENCOUNTER — Ambulatory Visit (INDEPENDENT_AMBULATORY_CARE_PROVIDER_SITE_OTHER): Payer: BC Managed Care – PPO | Admitting: Gynecology

## 2019-08-25 ENCOUNTER — Encounter: Payer: Self-pay | Admitting: Gynecology

## 2019-08-25 ENCOUNTER — Other Ambulatory Visit: Payer: Self-pay

## 2019-08-25 VITALS — BP 124/80 | Ht 63.5 in | Wt 227.0 lb

## 2019-08-25 DIAGNOSIS — Z13220 Encounter for screening for lipoid disorders: Secondary | ICD-10-CM

## 2019-08-25 DIAGNOSIS — D251 Intramural leiomyoma of uterus: Secondary | ICD-10-CM

## 2019-08-25 DIAGNOSIS — N912 Amenorrhea, unspecified: Secondary | ICD-10-CM | POA: Diagnosis not present

## 2019-08-25 DIAGNOSIS — D252 Subserosal leiomyoma of uterus: Secondary | ICD-10-CM

## 2019-08-25 DIAGNOSIS — Z23 Encounter for immunization: Secondary | ICD-10-CM | POA: Diagnosis not present

## 2019-08-25 DIAGNOSIS — Z01419 Encounter for gynecological examination (general) (routine) without abnormal findings: Secondary | ICD-10-CM | POA: Diagnosis not present

## 2019-08-25 MED ORDER — VALACYCLOVIR HCL 500 MG PO TABS: 500.0000 mg | ORAL_TABLET | Freq: Every day | ORAL | 12 refills | Status: DC

## 2019-08-25 MED ORDER — MEGESTROL ACETATE 20 MG PO TABS: 20.0000 mg | ORAL_TABLET | Freq: Every day | ORAL | 6 refills | Status: DC

## 2019-08-25 NOTE — Progress Notes (Signed)
    Jessica Henry 07-Jun-1966 [id number]        53 y.o.  [id number] for annual gynecologic exam.  Remains without menses on Megace 20 mg daily.  Had been on Aygestin but had a reaction to it.  History of leiomyoma as discussed below.  Past medical history,surgical history, problem list, medications, allergies, family history and social history were all reviewed and documented as reviewed in the EPIC chart.  ROS:  Performed with pertinent positives and negatives included in the history, assessment and plan.   Additional significant findings : None   Exam: Jessica Henry assistant Vitals:   08/25/19 1543  BP: 124/80  Weight: 227 lb (103 kg)  Height: 5' 3.5" (1.613 m)   Body mass index is 39.58 kg/m.  General appearance:  Normal affect, orientation and appearance. Skin: Grossly normal HEENT: Without gross lesions.  No cervical or supraclavicular adenopathy. Thyroid normal.  Lungs:  Clear without wheezing, rales or rhonchi Cardiac: RR, without RMG Abdominal:  Soft, nontender with pelvic mass to the level of the umbilicus consistent with her history of leiomyoma Breasts:  Examined lying and sitting without masses, retractions, discharge or axillary adenopathy. Pelvic:  Ext, BUS, Vagina: Normal  Cervix: Normal.  Pap smear done  Uterus: 20+ weeks size midline irregular filling the pelvis.  Adnexa: Unable to evaluate due to uterine bulk.  Anus and perineum: Normal   Rectovaginal: Normal sphincter tone without palpated masses or tenderness.    Assessment/Plan:  53 y.o. [id number] female for annual gynecologic exam.  Without menses on Megace 20 mg daily.  History of tubal sterilization  1. Leiomyoma to the level of the umbilicus.  History of borderline anemia.  I have discussed surgery on multiple occasions and the patient has declined.  Ultrasound 02/2019 confirms multiple myomas.  Both ovaries visualized and normal.  Currently on Megace 20 mg daily for menstrual suppression and has not had any  bleeding since starting in June.  Will check [place] today.  Discussed stopping the Megace and will rediscuss after blood work returns.  Also check her CBC today.  Megace 20 mg #30 with 6 refills provided. 2. Mammography coming due in December and I reminded her to schedule this. 3. Pap smear/HPV 2016.  Pap smear done today.  No history of abnormal Pap smears previously. 4. History of HSV.  Uses Valtrex 500 mg daily for suppression.  Refill x1 year provided. 5. Colonoscopy never.  Recommend scheduling next year as Covid restrictions loosened. 6. Health maintenance.  Requests baseline labs.  CBC, CMP lipid profile FSH and TSH ordered.  Follow-up for results and management decision as far as Megace.  Follow-up in 1 year for annual exam.   Anastasio Auerbach MD, 4:22 PM 08/25/2019

## 2019-08-25 NOTE — Addendum Note (Signed)
Addended by: Nelva Nay on: 08/25/2019 04:47 PM   Modules accepted: Orders

## 2019-08-25 NOTE — Patient Instructions (Signed)
Follow-up for blood test results.  Follow-up in 1 year for annual exam

## 2019-08-26 LAB — LIPID PANEL
Cholesterol: 166 mg/dL (ref <200)
HDL: 38 mg/dL — ABNORMAL LOW (ref >=50)
LDL Cholesterol (Calc): 105 mg/dL (calc) — ABNORMAL HIGH
Non-HDL Cholesterol (Calc): 128 mg/dL (calc) (ref <130)
Total CHOL/HDL Ratio: 4.4 (calc) (ref <5.0)
Triglycerides: 130 mg/dL (ref <150)

## 2019-08-26 LAB — COMPREHENSIVE METABOLIC PANEL
AG Ratio: 1.6 (calc) (ref 1.0–2.5)
ALT: 13 U/L (ref 6–29)
AST: 13 U/L (ref 10–35)
Albumin: 4.5 g/dL (ref 3.6–5.1)
Alkaline phosphatase (APISO): 59 U/L (ref 37–153)
BUN: 14 mg/dL (ref 7–25)
CO2: 28 mmol/L (ref 20–32)
Calcium: 9.6 mg/dL (ref 8.6–10.4)
Chloride: 107 mmol/L (ref 98–110)
Creat: 0.82 mg/dL (ref 0.50–1.05)
Globulin: 2.9 g/dL (calc) (ref 1.9–3.7)
Glucose, Bld: 143 mg/dL — ABNORMAL HIGH (ref 65–99)
Potassium: 3.7 mmol/L (ref 3.5–5.3)
Sodium: 141 mmol/L (ref 135–146)
Total Bilirubin: 0.3 mg/dL (ref 0.2–1.2)
Total Protein: 7.4 g/dL (ref 6.1–8.1)

## 2019-08-26 LAB — PAP IG W/ RFLX HPV ASCU

## 2019-08-26 LAB — CBC WITH DIFFERENTIAL/PLATELET
Absolute Monocytes: 840 cells/uL (ref 200–950)
Basophils Absolute: 45 cells/uL (ref 0–200)
Basophils Relative: 0.4 %
Eosinophils Absolute: 224 cells/uL (ref 15–500)
Eosinophils Relative: 2 %
HCT: 36.5 % (ref 35.0–45.0)
Hemoglobin: 11.2 g/dL — ABNORMAL LOW (ref 11.7–15.5)
Lymphs Abs: 3069 cells/uL (ref 850–3900)
MCH: 22.6 pg — ABNORMAL LOW (ref 27.0–33.0)
MCHC: 30.7 g/dL — ABNORMAL LOW (ref 32.0–36.0)
MCV: 73.6 fL — ABNORMAL LOW (ref 80.0–100.0)
MPV: 11.5 fL (ref 7.5–12.5)
Monocytes Relative: 7.5 %
Neutro Abs: 7022 cells/uL (ref 1500–7800)
Neutrophils Relative %: 62.7 %
Platelets: 346 10*3/uL (ref 140–400)
RBC: 4.96 10*6/uL (ref 3.80–5.10)
RDW: 17.9 % — ABNORMAL HIGH (ref 11.0–15.0)
Total Lymphocyte: 27.4 %
WBC: 11.2 10*3/uL — ABNORMAL HIGH (ref 3.8–10.8)

## 2019-08-26 LAB — TSH: TSH: 2.3 mIU/L

## 2019-08-26 LAB — FOLLICLE STIMULATING HORMONE: FSH: 12.6 m[IU]/mL

## 2019-09-08 ENCOUNTER — Other Ambulatory Visit: Payer: Self-pay

## 2019-09-08 DIAGNOSIS — R7309 Other abnormal glucose: Secondary | ICD-10-CM

## 2019-09-17 ENCOUNTER — Other Ambulatory Visit: Payer: Self-pay

## 2019-09-18 ENCOUNTER — Other Ambulatory Visit: Payer: BC Managed Care – PPO

## 2019-09-18 ENCOUNTER — Other Ambulatory Visit: Payer: Self-pay

## 2019-09-18 DIAGNOSIS — R7309 Other abnormal glucose: Secondary | ICD-10-CM | POA: Diagnosis not present

## 2019-09-19 LAB — GLUCOSE, FASTING: Glucose, Plasma: 118 mg/dL — ABNORMAL HIGH (ref 65–99)

## 2019-09-19 LAB — HEMOGLOBIN A1C
Hgb A1c MFr Bld: 8.6 % of total Hgb — ABNORMAL HIGH (ref <5.7)
Mean Plasma Glucose: 200 (calc)
eAG (mmol/L): 11.1 (calc)

## 2019-10-29 DIAGNOSIS — I498 Other specified cardiac arrhythmias: Secondary | ICD-10-CM | POA: Diagnosis not present

## 2019-10-29 DIAGNOSIS — R002 Palpitations: Secondary | ICD-10-CM | POA: Diagnosis not present

## 2019-10-29 DIAGNOSIS — K5900 Constipation, unspecified: Secondary | ICD-10-CM | POA: Diagnosis not present

## 2020-01-16 ENCOUNTER — Telehealth: Payer: Self-pay | Admitting: *Deleted

## 2020-01-16 NOTE — Telephone Encounter (Signed)
Patient states she was referred from [facility] and is calling to schedule. She states she is currently at work and her daughter may answer the phone and to leave a message with her.

## 2020-01-16 NOTE — Telephone Encounter (Signed)
Per scheduler, notes are needed from hospital stay at [facility] before they can make appointment.

## 2020-01-22 DIAGNOSIS — J309 Allergic rhinitis, unspecified: Secondary | ICD-10-CM | POA: Diagnosis not present

## 2020-01-22 DIAGNOSIS — I10 Essential (primary) hypertension: Secondary | ICD-10-CM | POA: Diagnosis not present

## 2020-01-22 DIAGNOSIS — R002 Palpitations: Secondary | ICD-10-CM | POA: Diagnosis not present

## 2020-01-30 DIAGNOSIS — I10 Essential (primary) hypertension: Secondary | ICD-10-CM | POA: Diagnosis not present

## 2020-01-30 DIAGNOSIS — R002 Palpitations: Secondary | ICD-10-CM | POA: Diagnosis not present

## 2020-02-05 DIAGNOSIS — I10 Essential (primary) hypertension: Secondary | ICD-10-CM | POA: Diagnosis not present

## 2020-02-05 DIAGNOSIS — R002 Palpitations: Secondary | ICD-10-CM | POA: Diagnosis not present

## 2020-03-02 DIAGNOSIS — I10 Essential (primary) hypertension: Secondary | ICD-10-CM | POA: Diagnosis not present

## 2020-03-02 DIAGNOSIS — J309 Allergic rhinitis, unspecified: Secondary | ICD-10-CM | POA: Diagnosis not present

## 2020-03-02 DIAGNOSIS — I471 Supraventricular tachycardia: Secondary | ICD-10-CM | POA: Diagnosis not present

## 2020-04-23 DIAGNOSIS — J309 Allergic rhinitis, unspecified: Secondary | ICD-10-CM | POA: Diagnosis not present

## 2020-04-23 DIAGNOSIS — I471 Supraventricular tachycardia: Secondary | ICD-10-CM | POA: Diagnosis not present

## 2020-04-23 DIAGNOSIS — I10 Essential (primary) hypertension: Secondary | ICD-10-CM | POA: Diagnosis not present

## 2020-06-22 DIAGNOSIS — J3089 Other allergic rhinitis: Secondary | ICD-10-CM | POA: Diagnosis not present

## 2020-06-22 DIAGNOSIS — I10 Essential (primary) hypertension: Secondary | ICD-10-CM | POA: Diagnosis not present

## 2020-06-22 DIAGNOSIS — E786 Lipoprotein deficiency: Secondary | ICD-10-CM | POA: Diagnosis not present

## 2020-08-26 ENCOUNTER — Ambulatory Visit (INDEPENDENT_AMBULATORY_CARE_PROVIDER_SITE_OTHER): Payer: BC Managed Care – PPO | Admitting: Obstetrics and Gynecology

## 2020-08-26 ENCOUNTER — Encounter: Payer: Self-pay | Admitting: Obstetrics and Gynecology

## 2020-08-26 ENCOUNTER — Other Ambulatory Visit: Payer: Self-pay

## 2020-08-26 VITALS — BP 124/78 | Ht 64.0 in | Wt 220.0 lb

## 2020-08-26 DIAGNOSIS — D251 Intramural leiomyoma of uterus: Secondary | ICD-10-CM | POA: Diagnosis not present

## 2020-08-26 DIAGNOSIS — Z13220 Encounter for screening for lipoid disorders: Secondary | ICD-10-CM

## 2020-08-26 DIAGNOSIS — Z01419 Encounter for gynecological examination (general) (routine) without abnormal findings: Secondary | ICD-10-CM

## 2020-08-26 DIAGNOSIS — Z23 Encounter for immunization: Secondary | ICD-10-CM

## 2020-08-26 DIAGNOSIS — D252 Subserosal leiomyoma of uterus: Secondary | ICD-10-CM

## 2020-08-26 DIAGNOSIS — R7309 Other abnormal glucose: Secondary | ICD-10-CM | POA: Diagnosis not present

## 2020-08-26 DIAGNOSIS — N921 Excessive and frequent menstruation with irregular cycle: Secondary | ICD-10-CM | POA: Diagnosis not present

## 2020-08-26 DIAGNOSIS — D250 Submucous leiomyoma of uterus: Secondary | ICD-10-CM | POA: Diagnosis not present

## 2020-08-26 MED ORDER — VALACYCLOVIR HCL 500 MG PO TABS: 500.0000 mg | ORAL_TABLET | Freq: Every day | ORAL | 4 refills | Status: DC

## 2020-08-26 NOTE — Progress Notes (Signed)
Jessica Henry [birth year] [id number]  SUBJECTIVE:  55 y.o. [id number] female for annual routine gynecologic exam. She has no gynecologic concerns.  Current Outpatient Medications  Medication Sig Dispense Refill  . BYSTOLIC 10 MG tablet Take 10 mg by mouth daily. Reported on 11/25/2015  3  . fish oil-omega-3 fatty acids 1000 MG capsule Take 1 g by mouth every morning.     Marland Kitchen lisinopril-hydrochlorothiazide (PRINZIDE,ZESTORETIC) 20-12.5 MG tablet Take 1 tablet by mouth daily. Reported on 11/25/2015  3  . mometasone (NASONEX) 50 MCG/ACT nasal spray Use two sprays in each nostril once daily. 17 g 11  . Multiple Vitamin (MULTIVITAMIN) tablet Take 1 tablet by mouth every morning.     . valACYclovir (VALTREX) 500 MG tablet Take 1 tablet (500 mg total) by mouth daily. For outbreaks. 30 tablet 12   No current facility-administered medications for this visit.   Allergies: Gadolinium derivatives and Penicillins  Patient's last menstrual period was 08/15/2020.  Past medical history,surgical history, problem list, medications, allergies, family history and social history were all reviewed and documented as reviewed in the EPIC chart.  ROS: Pertinent positives and negatives as reviewed in HPI    OBJECTIVE:  BP 124/78   Ht 5\' 4"  (1.626 m)   Wt 220 lb (99.8 kg)   LMP 08/15/2020   BMI 37.76 kg/m  The patient appears well, alert, oriented, in no distress. ENT normal.  Neck supple. No cervical or supraclavicular adenopathy or thyromegaly.  Lungs are clear, good air entry, no wheezes, rhonchi or rales. S1 and S2 normal, no murmurs, regular rate and rhythm.  Abdomen soft without tenderness, guarding, mass or organomegaly.  Neurological is normal, no focal findings.  BREAST EXAM: breasts appear normal, no suspicious masses, no skin or nipple changes or axillary nodes, bandage over her left inner breast due to recurring skin cyst in the area she reports is not draining anymore  PELVIC EXAM: VULVA: normal  appearing vulva with no masses, tenderness or lesions, VAGINA: normal appearing vagina with normal color and discharge, no lesions, CERVIX: normal appearing cervix without discharge or lesions, UTERUS: uterus is enlarged to 20 weeks size with fundus to the umbilicus and bulky with filling of the pelvis, nontender, ADNEXA: Nontender but unable to evaluate on examination due to the bulk of the uterus, RECTAL: normal rectal, no masses  Chaperone: [name] present during the examination  ASSESSMENT:  54 y.o. [id number] here for annual gynecologic exam  PLAN:   1.  Enlarged fibroid uterus.  Leiomyoma with uterus that palpates to level of umbilicus, seems to be stable from previous exams.  Also with history of borderline anemia.  Ultrasound indicates that there appears to be endometrial distortion by submucosal leiomyoma.  She has declined surgery in the past after discussion.  Ultrasound 02/2019 indicates multiple myomas.  She says her last period was normal.  He has stopped taking Megace for the past few months.  She will keep an eye on her periods and let us know if she does go back into a pattern of heavy bleeding.  [place] last year was normal range.  Her menstrual cycle remains fairly regular.  We will continue to monitor fibroid on her pelvic exams for now but if there is an indicator of growth such as increased abdominal discomfort, worsening menstrual bleeding, dysmenorrhea, we may need to consider other options.  Apparently she has had a skin reaction to norethindrone/Aygestin in the past.  Other than Megace, we briefly discussed another option could include  tranexamic acid for control of heavy periods. 2. Pap smear 08/2019.  No significant history of abnormal Pap smears.  Next Pap smear due 2023 following the current guidelines recommending the 3 year interval. 3. Mammogram 09/2018.  Normal breast exam today.  She says she has a recurring skin cyst for which she did see a specialist about and periodically  will get recurrence of the skin cyst with spontaneous drainage.  She will continue to follow-up there for any recommendations regarding skin cysts.  She is reminded to schedule an annual mammogram soon as she is overdue. 4.  Colonoscopy never.  Colon cancer among the most common cancers diagnosed in women and early detection is best supported by screening.  I recommend that she schedule colonoscopy soon and contacts are available upon checkout today.   5. History of HSV.  Uses Valtrex 500 mg daily for suppression.  Refill x1 year provided. 6. Health maintenance.  She will proceed to lab today for routine screening blood work (lipids, CBC, CMP).  Also will check hemoglobin A1c as this was elevated last year to 8.6%, indicating diabetes mellitus, recommendations were made to seek a primary care doctor to work on glucose management, will reinforce that recommendation if the A1c is still high today.  Return annually or sooner, prn.  Joseph Pierini MD 08/26/20

## 2020-08-27 LAB — LIPID PANEL
Cholesterol: 154 mg/dL (ref <200)
HDL: 42 mg/dL — ABNORMAL LOW (ref >=50)
LDL Cholesterol (Calc): 91 mg/dL (calc)
Non-HDL Cholesterol (Calc): 112 mg/dL (calc) (ref <130)
Total CHOL/HDL Ratio: 3.7 (calc) (ref <5.0)
Triglycerides: 111 mg/dL (ref <150)

## 2020-08-27 LAB — CBC
HCT: 34.9 % — ABNORMAL LOW (ref 35.0–45.0)
Hemoglobin: 11 g/dL — ABNORMAL LOW (ref 11.7–15.5)
MCH: 24.7 pg — ABNORMAL LOW (ref 27.0–33.0)
MCHC: 31.5 g/dL — ABNORMAL LOW (ref 32.0–36.0)
MCV: 78.3 fL — ABNORMAL LOW (ref 80.0–100.0)
MPV: 10.7 fL (ref 7.5–12.5)
Platelets: 317 10*3/uL (ref 140–400)
RBC: 4.46 10*6/uL (ref 3.80–5.10)
RDW: 14.2 % (ref 11.0–15.0)
WBC: 9.7 10*3/uL (ref 3.8–10.8)

## 2020-08-27 LAB — COMPREHENSIVE METABOLIC PANEL
AG Ratio: 1.4 (calc) (ref 1.0–2.5)
ALT: 17 U/L (ref 6–29)
AST: 16 U/L (ref 10–35)
Albumin: 4.2 g/dL (ref 3.6–5.1)
Alkaline phosphatase (APISO): 56 U/L (ref 37–153)
BUN: 10 mg/dL (ref 7–25)
CO2: 24 mmol/L (ref 20–32)
Calcium: 9.6 mg/dL (ref 8.6–10.4)
Chloride: 102 mmol/L (ref 98–110)
Creat: 0.64 mg/dL (ref 0.50–1.05)
Globulin: 3.1 g/dL (calc) (ref 1.9–3.7)
Glucose, Bld: 95 mg/dL (ref 65–99)
Potassium: 3.9 mmol/L (ref 3.5–5.3)
Sodium: 137 mmol/L (ref 135–146)
Total Bilirubin: 0.4 mg/dL (ref 0.2–1.2)
Total Protein: 7.3 g/dL (ref 6.1–8.1)

## 2020-08-27 LAB — HEMOGLOBIN A1C
Hgb A1c MFr Bld: 6.1 % of total Hgb — ABNORMAL HIGH (ref <5.7)
Mean Plasma Glucose: 128 (calc)
eAG (mmol/L): 7.1 (calc)

## 2020-09-20 DIAGNOSIS — I10 Essential (primary) hypertension: Secondary | ICD-10-CM | POA: Diagnosis not present

## 2020-09-20 DIAGNOSIS — J019 Acute sinusitis, unspecified: Secondary | ICD-10-CM | POA: Diagnosis not present

## 2020-09-20 DIAGNOSIS — Z6837 Body mass index (BMI) 37.0-37.9, adult: Secondary | ICD-10-CM | POA: Diagnosis not present

## 2020-09-20 DIAGNOSIS — Z20828 Contact with and (suspected) exposure to other viral communicable diseases: Secondary | ICD-10-CM | POA: Diagnosis not present

## 2020-10-14 ENCOUNTER — Encounter: Payer: BC Managed Care – PPO | Admitting: Obstetrics and Gynecology

## 2021-01-19 ENCOUNTER — Telehealth: Payer: Self-pay | Admitting: *Deleted

## 2021-01-19 NOTE — Telephone Encounter (Signed)
Patient called to report her cycle has gotten heavier wanted to discuss medication management to help with this. I advised patient best to schedule office visit with provider to discuss. Patient said she will call back.

## 2021-02-01 ENCOUNTER — Ambulatory Visit: Payer: BC Managed Care – PPO | Admitting: Obstetrics & Gynecology

## 2021-02-01 DIAGNOSIS — Z0289 Encounter for other administrative examinations: Secondary | ICD-10-CM

## 2021-03-08 ENCOUNTER — Other Ambulatory Visit: Payer: Self-pay | Admitting: Internal Medicine

## 2021-03-08 DIAGNOSIS — Z1231 Encounter for screening mammogram for malignant neoplasm of breast: Secondary | ICD-10-CM

## 2021-03-10 ENCOUNTER — Ambulatory Visit: Payer: BLUE CROSS/BLUE SHIELD

## 2021-04-02 DIAGNOSIS — Z20828 Contact with and (suspected) exposure to other viral communicable diseases: Secondary | ICD-10-CM | POA: Diagnosis not present

## 2021-04-02 DIAGNOSIS — J0100 Acute maxillary sinusitis, unspecified: Secondary | ICD-10-CM | POA: Diagnosis not present

## 2021-07-26 DIAGNOSIS — D259 Leiomyoma of uterus, unspecified: Secondary | ICD-10-CM | POA: Diagnosis not present

## 2021-07-26 DIAGNOSIS — N858 Other specified noninflammatory disorders of uterus: Secondary | ICD-10-CM | POA: Diagnosis not present

## 2021-07-26 DIAGNOSIS — R109 Unspecified abdominal pain: Secondary | ICD-10-CM | POA: Diagnosis not present

## 2021-07-26 DIAGNOSIS — N852 Hypertrophy of uterus: Secondary | ICD-10-CM | POA: Diagnosis not present

## 2021-09-06 ENCOUNTER — Encounter: Payer: BC Managed Care – PPO | Admitting: Obstetrics and Gynecology

## 2021-09-06 ENCOUNTER — Ambulatory Visit: Payer: Self-pay | Admitting: Obstetrics & Gynecology

## 2021-12-09 ENCOUNTER — Ambulatory Visit (INDEPENDENT_AMBULATORY_CARE_PROVIDER_SITE_OTHER): Payer: Managed Care, Other (non HMO) | Admitting: Obstetrics & Gynecology

## 2021-12-09 ENCOUNTER — Other Ambulatory Visit: Payer: Self-pay

## 2021-12-09 ENCOUNTER — Encounter: Payer: Self-pay | Admitting: Obstetrics & Gynecology

## 2021-12-09 ENCOUNTER — Other Ambulatory Visit (HOSPITAL_COMMUNITY)
Admission: RE | Admit: 2021-12-09 | Discharge: 2021-12-09 | Disposition: A | Discharge status: Home / Self Care | Payer: Managed Care, Other (non HMO) | Source: Ambulatory Visit | Attending: Obstetrics & Gynecology | Admitting: Obstetrics & Gynecology

## 2021-12-09 VITALS — BP 114/80 | HR 72 | Resp 16 | Ht 62.75 in | Wt 220.0 lb

## 2021-12-09 DIAGNOSIS — Z01419 Encounter for gynecological examination (general) (routine) without abnormal findings: Secondary | ICD-10-CM

## 2021-12-09 DIAGNOSIS — Z8619 Personal history of other infectious and parasitic diseases: Secondary | ICD-10-CM | POA: Diagnosis not present

## 2021-12-09 DIAGNOSIS — N920 Excessive and frequent menstruation with regular cycle: Secondary | ICD-10-CM

## 2021-12-09 DIAGNOSIS — D219 Benign neoplasm of connective and other soft tissue, unspecified: Secondary | ICD-10-CM | POA: Diagnosis not present

## 2021-12-09 DIAGNOSIS — Z9851 Tubal ligation status: Secondary | ICD-10-CM | POA: Diagnosis not present

## 2021-12-09 MED ORDER — NORETHINDRONE 0.35 MG PO TABS: 1.0000 | ORAL_TABLET | Freq: Every day | ORAL | 4 refills | Status: DC

## 2021-12-09 MED ORDER — VALACYCLOVIR HCL 500 MG PO TABS: 500.0000 mg | ORAL_TABLET | Freq: Every day | ORAL | 4 refills | Status: DC

## 2021-12-09 NOTE — Addendum Note (Signed)
Addended by: Princess Bruins on: 12/09/2021 02:29 PM ? ? Modules accepted: Orders ? ?

## 2021-12-09 NOTE — Progress Notes (Addendum)
? ? ?Jessica Henry 1965/12/07 [id number] ? ? ?History:    56 y.o. G3P3L3  S/P TL. ? ?RP:  Established patient presenting for annual gyn exam  ? ?HPI:  Enlarged fibroid uterus, at the umbilicus at last year's exam.  Ultrasound 02/2019 indicates multiple myomas.  Menses every month with stable, rather heavy flow.  [place] 2 years ago was normal range. Pap smear 08/2019.  No significant history of abnormal Pap smears.  Pap reflex today. Breasts normal.  Mammogram 09/2018, will schedule now.  Colonoscopy to schedule. History of HSV.  Uses Valtrex prophylaxis.  BMI 39.28.  Fasting health labs here today.  Requests a Cotinine screen for work. ? ?Past medical history,surgical history, family history and social history were all reviewed and documented in the EPIC chart. ? ?Gynecologic History ?Patient's last menstrual period was 12/09/2021 (exact date). ? ?Obstetric History ?OB History  ?Gravida Para Term Preterm AB Living  ?3 3 2 1   3   ?SAB IAB Ectopic Multiple Live Births  ?           ?  ?# Outcome Date GA Lbr Len/2nd Weight Sex Delivery Anes PTL Lv  ?3 Preterm           ?2 Term           ?1 Term           ? ? ? ?ROS: A ROS was performed and pertinent positives and negatives are included in the history. ? GENERAL: No fevers or chills. HEENT: No change in vision, no earache, sore throat or sinus congestion. NECK: No pain or stiffness. CARDIOVASCULAR: No chest pain or pressure. No palpitations. PULMONARY: No shortness of breath, cough or wheeze. GASTROINTESTINAL: No abdominal pain, nausea, vomiting or diarrhea, melena or bright red blood per rectum. GENITOURINARY: No urinary frequency, urgency, hesitancy or dysuria. MUSCULOSKELETAL: No joint or muscle pain, no back pain, no recent trauma. DERMATOLOGIC: No rash, no itching, no lesions. ENDOCRINE: No polyuria, polydipsia, no heat or cold intolerance. No recent change in weight. HEMATOLOGICAL: No anemia or easy bruising or bleeding. NEUROLOGIC: No headache, seizures, numbness,  tingling or weakness. PSYCHIATRIC: No depression, no loss of interest in normal activity or change in sleep pattern.  ?  ? ?Exam: ? ? ?BP 114/80   Pulse 72   Resp 16   Ht 5' 2.75" (1.594 m)   Wt 220 lb (99.8 kg)   LMP 12/09/2021 (Exact Date) Comment: btl  BMI 39.28 kg/m?  ? ?Body mass index is 39.28 kg/m?. ? ?General appearance : Well developed well nourished female. No acute distress ?HEENT: Eyes: no retinal hemorrhage or exudates,  Neck supple, trachea midline, no carotid bruits, no thyroidmegaly ?Lungs: Clear to auscultation, no rhonchi or wheezes, or rib retractions  ?Heart: Regular rate and rhythm, no murmurs or gallops ?Breast:Examined in sitting and supine position were symmetrical in appearance, no palpable masses or tenderness,  no skin retraction, no nipple inversion, no nipple discharge, no skin discoloration, no axillary or supraclavicular lymphadenopathy ?Abdomen: no palpable masses or tenderness, no rebound or guarding ?Extremities: no edema or skin discoloration or tenderness ? ?Pelvic: Vulva: Normal ?            Vagina: No gross lesions or discharge ? Cervix: No gross lesions or discharge.  Pap reflex done. ? Uterus  About 20 cm at umbilicus, stable, non-tender and mobile. ? Adnexa  Without masses or tenderness ? Anus: Normal ? ? ?Assessment/Plan:  56 y.o. female for annual exam  ? ?  1. Encounter for routine gynecological examination with Papanicolaou smear of cervix ?Enlarged fibroid uterus, at the umbilicus at last year's exam.  Ultrasound 02/2019 indicates multiple myomas.  Menses every month with stable, rather heavy flow.  [place] 2 years ago was normal range. Pap smear 08/2019.  No significant history of abnormal Pap smears.  Pap reflex today. Breasts normal.  Mammogram 09/2018, will schedule now.  Colonoscopy to schedule. History of HSV.  Uses Valtrex prophylaxis.  BMI 39.28.  Fasting health labs here today.  Requests a Cotinine screen for work. ?- CBC ?- Comp Met (CMET) ?- Lipid Profile ?-  TSH ?- Vitamin D (25 hydroxy) ?- NICOTINE/COTININE SP ?- Pap reflex  ? ?2. S/P tubal ligation ? ?3. Fibroids ?Enlarged fibroid uterus, at the umbilicus at last year's exam.  Ultrasound 02/2019 indicates multiple myomas.  Menses every month with stable, rather heavy flow.  Decision to start on the Progestin pill to control the menstrual flow until menopause.  Usage reviewed.  No CI.  Prescription sent to pharmacy. ? ?4. Menorrhagia with regular cycle ?Enlarged fibroid uterus, at the umbilicus at last year's exam.  Ultrasound 02/2019 indicates multiple myomas.  Menses every month with stable, rather heavy flow.  Decision to start on the Progestin pill to control the menstrual flow until menopause.  Usage reviewed.  No CI.  Prescription sent to pharmacy. ? ?5. H/O herpes genitalis ?Continue on Valacyclovir prophylaxis. ? ?Other orders ?- hydrochlorothiazide (HYDRODIURIL) 12.5 MG tablet; Take 12.5 mg by mouth daily. ?- Ferrous Sulfate (IRON PO); Take by mouth. ?- norethindrone (MICRONOR) 0.35 MG tablet; Take 1 tablet (0.35 mg total) by mouth daily. ?- valACYclovir (VALTREX) 500 MG tablet; Take 1 tablet (500 mg total) by mouth daily. For outbreaks.  ? ?Princess Bruins MD, 12:11 PM 12/09/2021 ? ?  ?

## 2021-12-13 LAB — COMPREHENSIVE METABOLIC PANEL
AG Ratio: 1.5 (calc) (ref 1.0–2.5)
ALT: 19 U/L (ref 6–29)
AST: 19 U/L (ref 10–35)
Albumin: 4.8 g/dL (ref 3.6–5.1)
Alkaline phosphatase (APISO): 68 U/L (ref 37–153)
BUN: 11 mg/dL (ref 7–25)
CO2: 26 mmol/L (ref 20–32)
Calcium: 9.7 mg/dL (ref 8.6–10.4)
Chloride: 102 mmol/L (ref 98–110)
Creat: 0.63 mg/dL (ref 0.50–1.03)
Globulin: 3.1 g/dL (calc) (ref 1.9–3.7)
Glucose, Bld: 80 mg/dL (ref 65–99)
Potassium: 3.9 mmol/L (ref 3.5–5.3)
Sodium: 139 mmol/L (ref 135–146)
Total Bilirubin: 0.4 mg/dL (ref 0.2–1.2)
Total Protein: 7.9 g/dL (ref 6.1–8.1)

## 2021-12-13 LAB — CBC
HCT: 36.6 % (ref 35.0–45.0)
Hemoglobin: 11.4 g/dL — ABNORMAL LOW (ref 11.7–15.5)
MCH: 23.9 pg — ABNORMAL LOW (ref 27.0–33.0)
MCHC: 31.1 g/dL — ABNORMAL LOW (ref 32.0–36.0)
MCV: 76.7 fL — ABNORMAL LOW (ref 80.0–100.0)
MPV: 10.4 fL (ref 7.5–12.5)
Platelets: 394 10*3/uL (ref 140–400)
RBC: 4.77 10*6/uL (ref 3.80–5.10)
RDW: 14.4 % (ref 11.0–15.0)
WBC: 10.8 10*3/uL (ref 3.8–10.8)

## 2021-12-13 LAB — NICOTINE/COTININE SP
Cotinine: <2 ng/mL
Nicotine screen: <2 ng/mL

## 2021-12-13 LAB — LIPID PANEL
Cholesterol: 167 mg/dL (ref <200)
HDL: 54 mg/dL (ref >=50)
LDL Cholesterol (Calc): 94 mg/dL (calc)
Non-HDL Cholesterol (Calc): 113 mg/dL (calc) (ref <130)
Total CHOL/HDL Ratio: 3.1 (calc) (ref <5.0)
Triglycerides: 93 mg/dL (ref <150)

## 2021-12-13 LAB — VITAMIN D 25 HYDROXY (VIT D DEFICIENCY, FRACTURES): Vit D, 25-Hydroxy: 26 ng/mL — ABNORMAL LOW (ref 30–100)

## 2021-12-13 LAB — TSH: TSH: 2.27 mIU/L

## 2021-12-14 LAB — CYTOLOGY - PAP
Comment: NEGATIVE
Diagnosis: NEGATIVE
High risk HPV: NEGATIVE

## 2022-03-28 ENCOUNTER — Other Ambulatory Visit: Payer: Self-pay | Admitting: Obstetrics & Gynecology

## 2022-03-28 DIAGNOSIS — Z1231 Encounter for screening mammogram for malignant neoplasm of breast: Secondary | ICD-10-CM

## 2022-03-30 ENCOUNTER — Ambulatory Visit
Admission: RE | Admit: 2022-03-30 | Discharge: 2022-03-30 | Disposition: A | Discharge status: Home / Self Care | Payer: Self-pay | Source: Ambulatory Visit | Attending: Obstetrics & Gynecology | Admitting: Obstetrics & Gynecology

## 2022-03-30 DIAGNOSIS — Z1231 Encounter for screening mammogram for malignant neoplasm of breast: Secondary | ICD-10-CM

## 2022-11-29 ENCOUNTER — Telehealth: Payer: Self-pay

## 2022-11-29 ENCOUNTER — Encounter: Payer: Self-pay | Admitting: Obstetrics & Gynecology

## 2022-11-29 DIAGNOSIS — Z1231 Encounter for screening mammogram for malignant neoplasm of breast: Secondary | ICD-10-CM

## 2022-11-29 NOTE — Telephone Encounter (Signed)
Patient called in voice mail requesting referral for Diagnostic breast imaging at [facility].  I called her back but her voice mail box is full and I cannot leave a message. I have her scheduled for 1:45 appt tomorrow with Dr. Marguerita Merles to check in by 1:30pm.

## 2022-11-30 ENCOUNTER — Ambulatory Visit: Payer: Managed Care, Other (non HMO) | Admitting: Obstetrics & Gynecology

## 2022-12-05 ENCOUNTER — Encounter: Payer: Self-pay | Admitting: Obstetrics & Gynecology

## 2022-12-05 DIAGNOSIS — Z1231 Encounter for screening mammogram for malignant neoplasm of breast: Secondary | ICD-10-CM

## 2022-12-05 NOTE — Telephone Encounter (Signed)
I called patient at home (preferred #) but person who answered said patient not there and I could try her cell phone.  I called her cell phone but once again voice mail box is full and I cannot leave a message.

## 2022-12-11 DIAGNOSIS — I10 Essential (primary) hypertension: Secondary | ICD-10-CM | POA: Diagnosis not present

## 2022-12-11 DIAGNOSIS — Z2821 Immunization not carried out because of patient refusal: Secondary | ICD-10-CM | POA: Diagnosis not present

## 2022-12-11 DIAGNOSIS — S8992XS Unspecified injury of left lower leg, sequela: Secondary | ICD-10-CM | POA: Diagnosis not present

## 2022-12-11 DIAGNOSIS — R0989 Other specified symptoms and signs involving the circulatory and respiratory systems: Secondary | ICD-10-CM | POA: Diagnosis not present

## 2022-12-11 DIAGNOSIS — Z6839 Body mass index (BMI) 39.0-39.9, adult: Secondary | ICD-10-CM | POA: Diagnosis not present

## 2022-12-20 ENCOUNTER — Encounter: Payer: Self-pay | Admitting: Obstetrics & Gynecology

## 2022-12-20 ENCOUNTER — Ambulatory Visit (INDEPENDENT_AMBULATORY_CARE_PROVIDER_SITE_OTHER): Payer: 59 | Admitting: Obstetrics & Gynecology

## 2022-12-20 VITALS — BP 118/80 | HR 74 | Resp 16

## 2022-12-20 DIAGNOSIS — N6325 Unspecified lump in the left breast, overlapping quadrants: Secondary | ICD-10-CM

## 2022-12-20 DIAGNOSIS — D171 Benign lipomatous neoplasm of skin and subcutaneous tissue of trunk: Secondary | ICD-10-CM

## 2022-12-20 NOTE — Progress Notes (Signed)
    Jessica Henry September 20, 1966 [id number]        57 y.o.  [id number]   RP: Lump under left arm beside of breast x 2 months  HPI: Lump under left arm beside of breast x 2 months.  Not painful or tender.  Stable in size since she noticed.  Patient gained weight in the last year.  Mammo Neg 03/30/2022.  No fam h/o Breast Ca.    OB History  Gravida Para Term Preterm AB Living  3 3 2 1   3   SAB IAB Ectopic Multiple Live Births               # Outcome Date GA Lbr Len/2nd Weight Sex Delivery Anes PTL Lv  3 Preterm           2 Term           1 Term             Past medical history,surgical history, problem list, medications, allergies, family history and social history were all reviewed and documented in the EPIC chart.   Directed ROS with pertinent positives and negatives documented in the history of present illness/assessment and plan.  Exam:  Vitals:   12/20/22 1358  BP: 118/80  Pulse: 74  Resp: 16   General appearance:  Normal  Breast exam:  Rt breast and RT Axilla Normal                         Lt breast normal. Lt nipple normal, no discharge.  Skin normal.  Soft lump 3 x 3 cm at axillary line just outside the breast.  The lump is mobile and non-tender.  Lt Axilla normal.  Assessment/Plan:  57 y.o. [id number]   1. Breast lump on left side at 3 o'clock position Lump under left arm beside of breast x 2 months.  Not painful or tender.  Stable in size since she noticed.  Patient gained weight in the last year.  Mammo Neg 03/30/2022.  No fam h/o Breast Ca.  On exam:  Left breast lump, extra breast tissue?  More likely a Lipoma just beside the left breast.  Soft lump 3 x 3 cm at axillary line just outside the breast.  The lump is mobile and non-tender.  Lt Axilla normal. Schedule a Lt Dx Mammo/US.  2. Lipoma of torso  More likely a Lipoma just beside the left breast.  Soft lump 3 x 3 cm at axillary line just outside the breast.  The lump is mobile and non-tender.  Will complete the above  investigation.  Refer to General surgeon for removal per findings.  Princess Bruins MD, 2:01 PM 12/20/2022

## 2022-12-21 ENCOUNTER — Telehealth: Payer: Self-pay

## 2022-12-21 DIAGNOSIS — N6320 Unspecified lump in the left breast, unspecified quadrant: Secondary | ICD-10-CM

## 2022-12-21 NOTE — Telephone Encounter (Signed)
Schedule Lt Dx mammo/US Received: Jessica Girt, MD  P [facility] Triage Lump under left arm beside of breast x 2 months.  Not painful or tender.  Stable in size since she noticed.  Patient gained weight in the last year.  Mammo Neg 03/30/2022.  No fam h/o Breast Ca.  On exam:  Left breast lump, extra breast tissue?  More likely a Lipoma just beside the left breast.  Soft lump 3 x 3 cm at axillary line just outside the breast.  The lump is mobile and non-tender.  Lt Axilla normal. Schedule a Lt Dx Mammo/US.

## 2022-12-21 NOTE — Telephone Encounter (Signed)
Spoke with Effie Berkshire at [facility] and scheduled patient for 02/08/23 at 11:00am. Check in 15 minutes early.  I spoke with patient and informed her of date/time at [facility]. Provided phone number and prompts so she can call to check for earlier cancellation.

## 2022-12-25 ENCOUNTER — Other Ambulatory Visit: Payer: Self-pay

## 2022-12-25 MED ORDER — VALACYCLOVIR HCL 500 MG PO TABS: 500.0000 mg | ORAL_TABLET | Freq: Every day | ORAL | 0 refills | Status: DC

## 2022-12-25 NOTE — Telephone Encounter (Signed)
Needs refill on Valtrex.  Last AEX 3.3.23  Not scheduled.

## 2023-01-08 ENCOUNTER — Ambulatory Visit
Admission: RE | Admit: 2023-01-08 | Discharge: 2023-01-08 | Disposition: A | Discharge status: Home / Self Care | Payer: 59 | Source: Ambulatory Visit | Attending: Obstetrics & Gynecology | Admitting: Obstetrics & Gynecology

## 2023-01-08 ENCOUNTER — Other Ambulatory Visit: Payer: Self-pay | Admitting: Obstetrics & Gynecology

## 2023-01-08 ENCOUNTER — Ambulatory Visit
Admission: RE | Admit: 2023-01-08 | Discharge: 2023-01-08 | Disposition: A | Discharge status: Home / Self Care | Payer: Managed Care, Other (non HMO) | Source: Ambulatory Visit | Attending: Obstetrics & Gynecology | Admitting: Obstetrics & Gynecology

## 2023-01-08 DIAGNOSIS — N6320 Unspecified lump in the left breast, unspecified quadrant: Secondary | ICD-10-CM

## 2023-01-08 DIAGNOSIS — R922 Inconclusive mammogram: Secondary | ICD-10-CM | POA: Diagnosis not present

## 2023-01-08 DIAGNOSIS — N6323 Unspecified lump in the left breast, lower outer quadrant: Secondary | ICD-10-CM | POA: Diagnosis not present

## 2023-01-12 ENCOUNTER — Other Ambulatory Visit: Payer: 59

## 2023-01-24 ENCOUNTER — Ambulatory Visit
Admission: RE | Admit: 2023-01-24 | Discharge: 2023-01-24 | Disposition: A | Discharge status: Home / Self Care | Payer: 59 | Source: Ambulatory Visit | Attending: Obstetrics & Gynecology | Admitting: Obstetrics & Gynecology

## 2023-01-24 DIAGNOSIS — N6323 Unspecified lump in the left breast, lower outer quadrant: Secondary | ICD-10-CM | POA: Diagnosis not present

## 2023-01-24 DIAGNOSIS — N6320 Unspecified lump in the left breast, unspecified quadrant: Secondary | ICD-10-CM

## 2023-01-24 HISTORY — PX: BREAST BIOPSY: SHX20

## 2023-02-08 ENCOUNTER — Other Ambulatory Visit: Payer: Managed Care, Other (non HMO)

## 2023-07-16 ENCOUNTER — Other Ambulatory Visit: Payer: Self-pay | Admitting: Obstetrics and Gynecology

## 2023-07-16 ENCOUNTER — Encounter: Payer: Self-pay | Admitting: Obstetrics & Gynecology

## 2023-07-16 DIAGNOSIS — Z1231 Encounter for screening mammogram for malignant neoplasm of breast: Secondary | ICD-10-CM

## 2023-07-18 ENCOUNTER — Ambulatory Visit
Admission: RE | Admit: 2023-07-18 | Discharge: 2023-07-18 | Disposition: A | Discharge status: Home / Self Care | Payer: 59 | Source: Ambulatory Visit | Attending: Obstetrics and Gynecology | Admitting: Obstetrics and Gynecology

## 2023-07-18 DIAGNOSIS — Z1231 Encounter for screening mammogram for malignant neoplasm of breast: Secondary | ICD-10-CM | POA: Diagnosis not present

## 2023-08-03 ENCOUNTER — Other Ambulatory Visit: Payer: Self-pay | Admitting: Obstetrics & Gynecology

## 2023-08-03 NOTE — Telephone Encounter (Signed)
Med refill request: valacyclovir  500 mg tab Last AEX: 12/20/22 Next AEX: 08/20/23 Last MMG (if hormonal med)  Refill authorized: Last Rx was sent #90 with no refills on 12/25/22. Please approve or deny as appropriate.

## 2023-08-08 ENCOUNTER — Ambulatory Visit: Payer: 59 | Admitting: Obstetrics and Gynecology

## 2023-08-10 ENCOUNTER — Telehealth: Payer: Self-pay

## 2023-08-10 DIAGNOSIS — Z8619 Personal history of other infectious and parasitic diseases: Secondary | ICD-10-CM

## 2023-08-10 NOTE — Telephone Encounter (Signed)
Med refill request: valtrex 500 mg Last AEX: 12/10/22 Dr. Seymour Bars Next AEX: 08/20/23 Dr. Seymour Bars Last MMG (if hormonal med) n/a VM left on refill line requesting valtrex and topical cream.  Attempted to reach patient, no answer left message to return call (not detailed message per [facility]).  RX was sent 08/03/23 for valtrex 500mg  #60.  RF denied and rx noted to have patient call our office.  Sent to provider for review.

## 2023-08-13 NOTE — Telephone Encounter (Signed)
This is Dr. Edward Jolly covering for Dr. Kennith Center.  Message reviewed.  Patient has appointment with Dr. Kennith Center on 08/20/23.   Encounter reviewed and closed.

## 2023-08-17 ENCOUNTER — Telehealth: Payer: Self-pay

## 2023-08-17 DIAGNOSIS — M79671 Pain in right foot: Secondary | ICD-10-CM | POA: Diagnosis not present

## 2023-08-17 DIAGNOSIS — H6123 Impacted cerumen, bilateral: Secondary | ICD-10-CM | POA: Diagnosis not present

## 2023-08-17 NOTE — Telephone Encounter (Signed)
Pt Jessica Henry in triage line inquiring if we received VM and if she can pick up items from pharmacy.  Spoke w/ pt and she said that she was able to pick up the temporary refill of her valtrex. However, she is taking one tab daily and she is currently having an outbreak and doesn't feel as if the one tab at 500mg  dose is providing any relief. Pt inquiring if can increase oral dose and/or if can be prescribed any sort of cream or ointment to help w/ relief?   Has scheduled appt w/ [facility] on 08/20/2023 for AEX.  Please advise.

## 2023-08-17 NOTE — Telephone Encounter (Signed)
I would recommend OTC lidocaine gel

## 2023-08-17 NOTE — Telephone Encounter (Signed)
Increase to BID x 3 days

## 2023-08-17 NOTE — Telephone Encounter (Signed)
LVMTCB

## 2023-08-17 NOTE — Telephone Encounter (Signed)
Pt notified and voiced understanding. Encounter closed.

## 2023-08-20 ENCOUNTER — Encounter: Payer: Self-pay | Admitting: Obstetrics and Gynecology

## 2023-08-20 ENCOUNTER — Ambulatory Visit (INDEPENDENT_AMBULATORY_CARE_PROVIDER_SITE_OTHER): Payer: 59 | Admitting: Obstetrics and Gynecology

## 2023-08-20 VITALS — BP 144/76 | HR 98 | Ht 62.99 in | Wt 198.0 lb

## 2023-08-20 DIAGNOSIS — E66812 Obesity, class 2: Secondary | ICD-10-CM

## 2023-08-20 DIAGNOSIS — Z23 Encounter for immunization: Secondary | ICD-10-CM

## 2023-08-20 DIAGNOSIS — Z01419 Encounter for gynecological examination (general) (routine) without abnormal findings: Secondary | ICD-10-CM

## 2023-08-20 DIAGNOSIS — Z6835 Body mass index (BMI) 35.0-35.9, adult: Secondary | ICD-10-CM | POA: Diagnosis not present

## 2023-08-20 DIAGNOSIS — Z1211 Encounter for screening for malignant neoplasm of colon: Secondary | ICD-10-CM

## 2023-08-20 DIAGNOSIS — A6004 Herpesviral vulvovaginitis: Secondary | ICD-10-CM | POA: Insufficient documentation

## 2023-08-20 DIAGNOSIS — Z79899 Other long term (current) drug therapy: Secondary | ICD-10-CM | POA: Insufficient documentation

## 2023-08-20 DIAGNOSIS — R634 Abnormal weight loss: Secondary | ICD-10-CM | POA: Diagnosis not present

## 2023-08-20 DIAGNOSIS — D219 Benign neoplasm of connective and other soft tissue, unspecified: Secondary | ICD-10-CM | POA: Insufficient documentation

## 2023-08-20 MED ORDER — LIDOCAINE 5 % EX OINT
1.0000 | TOPICAL_OINTMENT | Freq: Three times a day (TID) | CUTANEOUS | 1 refills | Status: AC | PRN
Start: 2023-08-20 — End: ?

## 2023-08-20 MED ORDER — VALACYCLOVIR HCL 500 MG PO TABS
500.0000 mg | ORAL_TABLET | Freq: Two times a day (BID) | ORAL | 3 refills | Status: AC
Start: 2023-08-20 — End: 2023-08-25

## 2023-08-20 NOTE — Progress Notes (Signed)
57 y.o. G73P2103 female s/p BTL, multifibroid uterus, genital HSV here for annual exam. Married. Daughter recently left for college, studying at [employer].  Pt would like to do complete blood work and get a flu vaccine today.  Pt states she has lost about 20 pounds over the past 3 months without trying. Pt states no consistent excercising due to fatigue. Pt is currently taking prednisone for ear infection. Daughter recently left for college, studying at [employer]. Notes less of an appetite and wondering if it has to do with this. Eats 2-3 meals a day, usually a light dinner  Patient's last menstrual period was 07/23/2023 (approximate). Period Duration (Days): 2-3 Period Pattern: (!) Irregular Menstrual Flow: Light Menstrual Control: Tampon Dysmenorrhea: None Dysmenorrhea Symptoms: Cramping, Headache  Abnormal bleeding: no Pelvic discharge or pain: none Breast mass, nipple discharge or skin changes : none Birth control: none Last PAP:     Component Value Date/Time   DIAGPAP  12/09/2021 1429    - Negative for intraepithelial lesion or malignancy (NILM)   HPVHIGH Negative 12/09/2021 1429   ADEQPAP  12/09/2021 1429    Satisfactory for evaluation; transformation zone component PRESENT.   Last mammogram: 07/18/23 BIRADS 1, density b Last colonoscopy: never Sexually active: yes  Exercising: no  GYN HISTORY: s/p BTL multifibroid uterus genital HSV  OB History  Gravida Para Term Preterm AB Living  3 3 2 1   3   SAB IAB Ectopic Multiple Live Births               # Outcome Date GA Lbr Len/2nd Weight Sex Type Anes PTL Lv  3 Preterm           2 Term           1 Term             Past Medical History:  Diagnosis Date   Anemia    Hypertension    Seasonal allergies    STD (sexually transmitted disease)    HSV    Past Surgical History:  Procedure Laterality Date   BREAST BIOPSY Left 01/24/2023   [place] LT BREAST BX W LOC DEV 1ST LESION IMG BX SPEC US GUIDE 01/24/2023 [facility]  MAMMOGRAPHY   CESAREAN SECTION     TUBAL LIGATION      Current Outpatient Medications on File Prior to Visit  Medication Sig Dispense Refill   Ferrous Sulfate (IRON PO) Take by mouth.     fish oil-omega-3 fatty acids 1000 MG capsule Take 1 g by mouth every morning.      hydrochlorothiazide (HYDRODIURIL) 12.5 MG tablet Take 12.5 mg by mouth daily.     mometasone (NASONEX) 50 MCG/ACT nasal spray Use two sprays in each nostril once daily. 17 g 11   Multiple Vitamin (MULTIVITAMIN) tablet Take 1 tablet by mouth every morning.      predniSONE (STERAPRED UNI-PAK 21 TAB) 10 MG (21) TBPK tablet TAKE 6 TABLETS ON DAY 1 AS DIRECTED ON PACKAGE AND DECREASE BY 1 TAB EACH DAY FOR A TOTAL OF 6 DAYS     valACYclovir (VALTREX) 500 MG tablet TAKE 1 TABLET (500 MG TOTAL) BY MOUTH DAILY. FOR OUTBREAKS. 30 tablet 0   No current facility-administered medications on file prior to visit.    Social History   Socioeconomic History   Marital status: Married    Spouse name: Not on file   Number of children: Not on file   Years of education: Not on file  Highest education level: Not on file  Occupational History   Not on file  Tobacco Use   Smoking status: Never   Smokeless tobacco: Never  Vaping Use   Vaping status: Never Used  Substance and Sexual Activity   Alcohol use: No    Alcohol/week: 0.0 standard drinks of alcohol   Drug use: No   Sexual activity: Yes    Partners: Male    Birth control/protection: Surgical    Comment: Tubal lig--1st intercourse 57 yo--Fewer than 5 partners  Other Topics Concern   Not on file  Social History Narrative   Not on file   Social Determinants of Health   Financial Resource Strain: Not on file  Food Insecurity: Not on file  Transportation Needs: Not on file  Physical Activity: Not on file  Stress: Not on file  Social Connections: Unknown (02/21/2022)   [facility]   Social Network    Social Network: Not on file  Intimate  Partner Violence: Unknown (01/13/2022)   Received from [employer], [facility]   HITS    Physically Hurt: Not on file    Insult or Talk Down To: Not on file    Threaten Physical Harm: Not on file    Scream or Curse: Not on file    Family History  Problem Relation Age of Onset   Diabetes Mother    Hypertension Mother    Heart disease Mother     Allergies  Allergen Reactions   Gadolinium Derivatives Swelling and Cough    Post contrast of IV Multihance pt began coughing, sneezing and had facial swelling, Rapid Response was needed and patient was taken to ED   Penicillins Hives      PE Today's Vitals   08/20/23 1518  BP: (!) 144/76  Pulse: 98  SpO2: 96%  Weight: 198 lb (89.8 kg)  Height: 5' 2.99" (1.6 m)   Body mass index is 35.08 kg/m.  Physical Exam Vitals reviewed. Exam conducted with a chaperone present.  Constitutional:      General: She is not in acute distress.    Appearance: Normal appearance.  HENT:     Head: Normocephalic and atraumatic.     Nose: Nose normal.  Eyes:     Extraocular Movements: Extraocular movements intact.     Conjunctiva/sclera: Conjunctivae normal.  Neck:     Thyroid: No thyroid mass or thyromegaly.  Pulmonary:     Effort: Pulmonary effort is normal.  Chest:  Breasts:    Breasts are symmetrical.     Right: Normal. No inverted nipple, mass, nipple discharge, skin change or tenderness.     Left: Normal. No inverted nipple, mass, nipple discharge, skin change or tenderness.  Genitourinary:    Comments: Declined pelvic due to HSV outbreak Musculoskeletal:        General: Normal range of motion.     Cervical back: Normal range of motion.  Neurological:     General: No focal deficit present.     Mental Status: She is alert.  Psychiatric:        Mood and Affect: Mood normal.        Behavior: Behavior normal.       Assessment and Plan:        Well woman exam with routine gynecological exam Assessment & Plan: Cervical cancer  screening performed according to ASCCP guidelines. Encouraged annual mammogram screening Colonoscopy, referral placed DXA N/A Labs and immunizations with her primary Encouraged safe sexual practices  as indicated Encouraged healthy lifestyle practices with diet and exercise For patients under 50-70yo, I recommend 1200mg  calcium daily and 600IU of vitamin D daily.   Orders: -     CBC -     Hemoglobin A1C w/out eAG -     TSH -     Lipid panel  Colon cancer screening -     Ambulatory referral to Gastroenterology  Weight loss Recommend follow-up with PCP -     COMPLETE METABOLIC PANEL WITH GFR -     HIV Antibody (routine testing w rflx) -     Sedimentation rate -     Ambulatory referral to Gastroenterology  Herpes simplex vulvovaginitis Reviewed increasing valtrex to 1g every day x5d. If not improved in 2 weeks, patient to RTO -     Lidocaine; Apply 1 Application topically 3 (three) times daily as needed.  Dispense: 50 g; Refill: 1  Class 2 obesity without serious comorbidity with body mass index (BMI) of 35.0 to 35.9 in adult, unspecified obesity type -     Lipid panel  Encounter for immunization -     Flu vaccine trivalent PF, 6mos and older(Flulaval,Afluria,Fluarix,Fluzone)    Rosalyn Gess, MD

## 2023-08-20 NOTE — Patient Instructions (Signed)
For patients under 50-55yo, I recommend 1200mg  calcium daily and 600IU of vitamin D daily. For patients over 55yo, I recommend 1200mg  calcium daily and 800IU of vitamin D daily.  Health Maintenance, Female Adopting a healthy lifestyle and getting preventive care are important in promoting health and wellness. Ask your health care provider about: The right schedule for you to have regular tests and exams. Things you can do on your own to prevent diseases and keep yourself healthy. What should I know about diet, weight, and exercise? Eat a healthy diet  Eat a diet that includes plenty of vegetables, fruits, low-fat dairy products, and lean protein. Do not eat a lot of foods that are high in solid fats, added sugars, or sodium. Maintain a healthy weight Body mass index (BMI) is used to identify weight problems. It estimates body fat based on height and weight. Your health care provider can help determine your BMI and help you achieve or maintain a healthy weight. Get regular exercise Get regular exercise. This is one of the most important things you can do for your health. Most adults should: Exercise for at least 150 minutes each week. The exercise should increase your heart rate and make you sweat (moderate-intensity exercise). Do strengthening exercises at least twice a week. This is in addition to the moderate-intensity exercise. Spend less time sitting. Even light physical activity can be beneficial. Watch cholesterol and blood lipids Have your blood tested for lipids and cholesterol at 55 years of age, then have this test every 5 years. Have your cholesterol levels checked more often if: Your lipid or cholesterol levels are high. You are older than 55 years of age. You are at high risk for heart disease. What should I know about cancer screening? Depending on your health history and family history, you may need to have cancer screening at various ages. This may include screening  for: Breast cancer. Cervical cancer. Colorectal cancer. Skin cancer. Lung cancer. What should I know about heart disease, diabetes, and high blood pressure? Blood pressure and heart disease High blood pressure causes heart disease and increases the risk of stroke. This is more likely to develop in people who have high blood pressure readings or are overweight. Have your blood pressure checked: Every 3-5 years if you are 83-55 years of age. Every year if you are 4 years old or older. Diabetes Have regular diabetes screenings. This checks your fasting blood sugar level. Have the screening done: Once every three years after age 68 if you are at a normal weight and have a low risk for diabetes. More often and at a younger age if you are overweight or have a high risk for diabetes. What should I know about preventing infection? Hepatitis B If you have a higher risk for hepatitis B, you should be screened for this virus. Talk with your health care provider to find out if you are at risk for hepatitis B infection. Hepatitis C Testing is recommended for: Everyone born from [birth year] through 1963. Anyone with known risk factors for hepatitis C. Sexually transmitted infections (STIs) Get screened for STIs, including gonorrhea and chlamydia, if: You are sexually active and are younger than 55 years of age. You are older than 55 years of age and your health care provider tells you that you are at risk for this type of infection. Your sexual activity has changed since you were last screened, and you are at increased risk for chlamydia or gonorrhea. Ask your health care provider if  you are at risk. Ask your health care provider about whether you are at high risk for HIV. Your health care provider may recommend a prescription medicine to help prevent HIV infection. If you choose to take medicine to prevent HIV, you should first get tested for HIV. You should then be tested every 3 months for as long as you  are taking the medicine. Osteoporosis and menopause Osteoporosis is a disease in which the bones lose minerals and strength with aging. This can result in bone fractures. If you are 44 years old or older, or if you are at risk for osteoporosis and fractures, ask your health care provider if you should: Be screened for bone loss. Take a calcium or vitamin D supplement to lower your risk of fractures. Be given hormone replacement therapy (HRT) to treat symptoms of menopause. Follow these instructions at home: Alcohol use Do not drink alcohol if: Your health care provider tells you not to drink. You are pregnant, may be pregnant, or are planning to become pregnant. If you drink alcohol: Limit how much you have to: 0-1 drink a day. Know how much alcohol is in your drink. In the [place] one drink equals one 12 oz bottle of beer (355 mL), one 5 oz glass of wine (148 mL), or one 1 oz glass of hard liquor (44 mL). Lifestyle Do not use any products that contain nicotine or tobacco. These products include cigarettes, chewing tobacco, and vaping devices, such as e-cigarettes. If you need help quitting, ask your health care provider. Do not use street drugs. Do not share needles. Ask your health care provider for help if you need support or information about quitting drugs. General instructions Schedule regular health, dental, and eye exams. Stay current with your vaccines. Tell your health care provider if: You often feel depressed. You have ever been abused or do not feel safe at home. Summary Adopting a healthy lifestyle and getting preventive care are important in promoting health and wellness. Follow your health care provider's instructions about healthy diet, exercising, and getting tested or screened for diseases. Follow your health care provider's instructions on monitoring your cholesterol and blood pressure. This information is not intended to replace advice given to you by your health  care provider. Make sure you discuss any questions you have with your health care provider. Document Revised: 02/14/2021 Document Reviewed: 02/14/2021 Elsevier Patient Education  2024 [employer].

## 2023-08-20 NOTE — Assessment & Plan Note (Signed)
Cervical cancer screening performed according to ASCCP guidelines. Encouraged annual mammogram screening Colonoscopy, referral placed DXA N/A Labs and immunizations with her primary Encouraged safe sexual practices as indicated Encouraged healthy lifestyle practices with diet and exercise For patients under 50-55yo, I recommend 1200mg  calcium daily and 600IU of vitamin D daily.

## 2023-08-21 ENCOUNTER — Encounter: Payer: Self-pay | Admitting: Obstetrics and Gynecology

## 2023-08-21 DIAGNOSIS — E119 Type 2 diabetes mellitus without complications: Secondary | ICD-10-CM | POA: Insufficient documentation

## 2023-08-21 LAB — COMPLETE METABOLIC PANEL WITH GFR
AG Ratio: 1.2 (calc) (ref 1.0–2.5)
ALT: 38 U/L — ABNORMAL HIGH (ref 6–29)
AST: 29 U/L (ref 10–35)
Albumin: 4.3 g/dL (ref 3.6–5.1)
Alkaline phosphatase (APISO): 114 U/L (ref 37–153)
BUN: 10 mg/dL (ref 7–25)
CO2: 26 mmol/L (ref 20–32)
Calcium: 10 mg/dL (ref 8.6–10.4)
Chloride: 95 mmol/L — ABNORMAL LOW (ref 98–110)
Creat: 0.71 mg/dL (ref 0.50–1.03)
Globulin: 3.5 g/dL (ref 1.9–3.7)
Glucose, Bld: 424 mg/dL — ABNORMAL HIGH (ref 65–99)
Potassium: 3.9 mmol/L (ref 3.5–5.3)
Sodium: 135 mmol/L (ref 135–146)
Total Bilirubin: 0.5 mg/dL (ref 0.2–1.2)
Total Protein: 7.8 g/dL (ref 6.1–8.1)
eGFR: 100 mL/min/{1.73_m2} (ref >=60)

## 2023-08-21 LAB — LIPID PANEL
Cholesterol: 217 mg/dL — ABNORMAL HIGH (ref <200)
HDL: 58 mg/dL (ref >=50)
LDL Cholesterol (Calc): 141 mg/dL — ABNORMAL HIGH
Non-HDL Cholesterol (Calc): 159 mg/dL — ABNORMAL HIGH (ref <130)
Total CHOL/HDL Ratio: 3.7 (calc) (ref <5.0)
Triglycerides: 84 mg/dL (ref <150)

## 2023-08-21 LAB — SEDIMENTATION RATE: Sed Rate: 11 mm/h (ref 0–30)

## 2023-08-21 LAB — CBC
HCT: 45.1 % — ABNORMAL HIGH (ref 35.0–45.0)
Hemoglobin: 13.9 g/dL (ref 11.7–15.5)
MCH: 25.4 pg — ABNORMAL LOW (ref 27.0–33.0)
MCHC: 30.8 g/dL — ABNORMAL LOW (ref 32.0–36.0)
MCV: 82.4 fL (ref 80.0–100.0)
MPV: 11.4 fL (ref 7.5–12.5)
Platelets: 331 10*3/uL (ref 140–400)
RBC: 5.47 10*6/uL — ABNORMAL HIGH (ref 3.80–5.10)
RDW: 12.7 % (ref 11.0–15.0)
WBC: 13.5 10*3/uL — ABNORMAL HIGH (ref 3.8–10.8)

## 2023-08-21 LAB — HEMOGLOBIN A1C W/OUT EAG: Hgb A1c MFr Bld: >14 %{Hb} — ABNORMAL HIGH (ref <5.7)

## 2023-08-21 LAB — HIV ANTIBODY (ROUTINE TESTING W REFLEX): HIV 1&2 Ab, 4th Generation: NONREACTIVE

## 2023-08-21 LAB — TSH: TSH: 0.68 m[IU]/L

## 2023-08-31 ENCOUNTER — Telehealth: Payer: Self-pay

## 2023-08-31 DIAGNOSIS — A6004 Herpesviral vulvovaginitis: Secondary | ICD-10-CM

## 2023-08-31 DIAGNOSIS — E119 Type 2 diabetes mellitus without complications: Secondary | ICD-10-CM | POA: Diagnosis not present

## 2023-08-31 DIAGNOSIS — N3000 Acute cystitis without hematuria: Secondary | ICD-10-CM | POA: Diagnosis not present

## 2023-08-31 NOTE — Telephone Encounter (Signed)
Per JC:"Nothing additional, recommendations remain the same."  CB on mobile #, no answer, did not LVM.   Per pt's request if does not answer, pt gave permission to [facility] on home VM @ [phone].  Routing to provider for final review and closing encounter.

## 2023-08-31 NOTE — Telephone Encounter (Signed)
Pt LVM requesting rx for a topical cream.   Has questions, requesting cb.   Pt calling to request rx for zovirax cream for for HSV.   UTD on AEX.   **Also, FYI advised pt about most recent lab results and recommendation about proceeding to nearest ER asap for acute hyperglycemia mgmt and possible dehydration.   Advised that her blood sugar could have changed btwn now and 11/11 when the lab was drawn but A1C was likely the same or not changed too much since she hasn't received any tx. Pt also advised to est w/ PCP at her earliest convenience.   Provided her # to [facility] PCP to contact and make appt.

## 2023-09-03 DIAGNOSIS — I10 Essential (primary) hypertension: Secondary | ICD-10-CM | POA: Diagnosis not present

## 2023-09-03 MED ORDER — ACYCLOVIR 5 % EX OINT
1.0000 | TOPICAL_OINTMENT | CUTANEOUS | 2 refills | Status: AC
Start: 2023-09-03 — End: 2023-09-10

## 2023-09-03 NOTE — Telephone Encounter (Signed)
Rx for zovirax sent.   Nothing additional regarding lab recommendations.

## 2023-09-03 NOTE — Addendum Note (Signed)
Addended by: Rosalyn Gess on: 09/03/2023 09:39 AM   Modules accepted: Orders

## 2023-09-03 NOTE — Telephone Encounter (Signed)
Per review of EPIC, patient is scheduled at [facility] on 09/05/23.

## 2023-09-05 ENCOUNTER — Encounter: Payer: Self-pay | Admitting: Family Medicine

## 2023-09-05 ENCOUNTER — Ambulatory Visit (INDEPENDENT_AMBULATORY_CARE_PROVIDER_SITE_OTHER): Payer: 59 | Admitting: Family Medicine

## 2023-09-05 VITALS — BP 132/82 | HR 80 | Temp 97.8°F | Ht 63.0 in | Wt 192.0 lb

## 2023-09-05 DIAGNOSIS — E1165 Type 2 diabetes mellitus with hyperglycemia: Secondary | ICD-10-CM | POA: Diagnosis not present

## 2023-09-05 DIAGNOSIS — H538 Other visual disturbances: Secondary | ICD-10-CM

## 2023-09-05 DIAGNOSIS — I10 Essential (primary) hypertension: Secondary | ICD-10-CM

## 2023-09-05 DIAGNOSIS — Z7984 Long term (current) use of oral hypoglycemic drugs: Secondary | ICD-10-CM

## 2023-09-05 DIAGNOSIS — N3000 Acute cystitis without hematuria: Secondary | ICD-10-CM | POA: Diagnosis not present

## 2023-09-05 DIAGNOSIS — K5900 Constipation, unspecified: Secondary | ICD-10-CM

## 2023-09-05 DIAGNOSIS — R002 Palpitations: Secondary | ICD-10-CM | POA: Insufficient documentation

## 2023-09-05 HISTORY — DX: Constipation, unspecified: K59.00

## 2023-09-05 LAB — GLUCOSE, POCT (MANUAL RESULT ENTRY): POC Glucose: 187 mg/dL — AB (ref 70–99)

## 2023-09-05 LAB — MICROALBUMIN / CREATININE URINE RATIO
Creatinine,U: 127.7 mg/dL
Microalb Creat Ratio: 2.3 mg/g (ref 0.0–30.0)
Microalb, Ur: 3 mg/dL — ABNORMAL HIGH (ref 0.0–1.9)

## 2023-09-05 NOTE — Progress Notes (Signed)
New Patient Office Visit  Subjective    Patient ID: Jessica Henry, female    DOB: 01-Aug-1966  Age: 57 y.o. MRN: [id number]  CC:  Chief Complaint  Patient presents with   Establish Care    Would like to discuss vision changes as well as recent dx of diabetes  doesn't have previous PCP    HPI Jessica Henry presents to establish care States that she was recently diagnosed with type 2 diabetes when she went to the hospital on 08/26/2023.  Labs reviewed by me, A1c was greater than 14 at that time, blood sugar was 484.  States that she was having increased thirst and urination, weight loss.  She attributed this to trying to eat more healthy and drinking plenty of fluids.  She does report cloudy or hazy vision that comes and goes.  States that she has seen an eye doctor for this, and they have put her on Restasis. She has never had a diabetic eye exam.  She is inquiring about what to eat to help stabilize blood sugars. She has recently started metformin as well, states that she had diarrhea the first couple of days, but that is also stabilized.  Had recent UTI, was given Cipro 500 mg twice daily x 10 days.  States she feels that this is too strong for her.  Has only been taking 1 tablet once daily.  Inquiring about stopping this medication  She does not check blood pressures at home, currently taking Diovan and HCTZ.  Denies dizziness, edema, other symptoms.  Medical history as outlined below  Outpatient Encounter Medications as of 09/05/2023  Medication Sig   acyclovir ointment (ZOVIRAX) 5 % Apply 1 Application topically every 3 (three) hours for 7 days.   cycloSPORINE (RESTASIS) 0.05 % ophthalmic emulsion Place 1 drop into both eyes 2 (two) times daily.   Ferrous Sulfate (IRON PO) Take by mouth.   fish oil-omega-3 fatty acids 1000 MG capsule Take 1 g by mouth every morning.    hydrochlorothiazide (HYDRODIURIL) 12.5 MG tablet Take 12.5 mg by mouth daily.   lidocaine (XYLOCAINE) 5  % ointment Apply 1 Application topically 3 (three) times daily as needed.   metFORMIN (GLUCOPHAGE) 500 MG tablet Take 500 mg by mouth 2 (two) times daily with a meal.   mometasone (NASONEX) 50 MCG/ACT nasal spray Use two sprays in each nostril once daily.   Multiple Vitamin (MULTIVITAMIN) tablet Take 1 tablet by mouth every morning.    valsartan (DIOVAN) 80 MG tablet Take 80 mg by mouth daily.   [DISCONTINUED] ciprofloxacin (CIPRO) 500 MG tablet Take 500 mg by mouth 2 (two) times daily.   [DISCONTINUED] predniSONE (STERAPRED UNI-PAK 21 TAB) 10 MG (21) TBPK tablet TAKE 6 TABLETS ON DAY 1 AS DIRECTED ON PACKAGE AND DECREASE BY 1 TAB EACH DAY FOR A TOTAL OF 6 DAYS   No facility-administered encounter medications on file as of 09/05/2023.    Past Medical History:  Diagnosis Date   Anemia    Constipation 09/05/2023   Hypertension    Seasonal allergies    STD (sexually transmitted disease)    HSV    Past Surgical History:  Procedure Laterality Date   BREAST BIOPSY Left 01/24/2023   [place] LT BREAST BX W LOC DEV 1ST LESION IMG BX SPEC US GUIDE 01/24/2023 [facility] MAMMOGR[facility]HY   CESAREAN SECTION     TUBAL LIGATION      Family History  Problem Relation Age of Onset   Diabetes Mother  Hypertension Mother    Heart disease Mother     Social History   Socioeconomic History   Marital status: Married    Spouse name: Not on file   Number of children: Not on file   Years of education: Not on file   Highest education level: Not on file  Occupational History   Not on file  Tobacco Use   Smoking status: Never   Smokeless tobacco: Never  Vaping Use   Vaping status: Never Used  Substance and Sexual Activity   Alcohol use: No    Alcohol/week: 0.0 standard drinks of alcohol   Drug use: No   Sexual activity: Yes    Partners: Male    Birth control/protection: Surgical    Comment: Tubal lig--1st intercourse 57 yo--Fewer than 5 partners  Other Topics Concern   Not on file  Social History  Narrative   Not on file   Social Determinants of Health   Financial Resource Strain: Not on file  Food Insecurity: Not on file  Transportation Needs: Not on file  Physical Activity: Not on file  Stress: Not on file  Social Connections: Unknown (02/21/2022)   Received from [facility], [facility]   Social Network    Social Network: Not on file  Intimate Partner Violence: Unknown (01/13/2022)   Received from [facility], [facility]   HITS    Physically Hurt: Not on file    Insult or Talk Down To: Not on file    Threaten Physical Harm: Not on file    Scream or Curse: Not on file    ROS Per HPI       Objective    BP 132/82 (BP Location: Left Arm, Patient Position: Sitting, Cuff Size: Large)   Pulse 80   Temp 97.8 F (36.6 C) (Temporal)   Ht 5\' 3"  (1.6 m)   Wt 192 lb (87.1 kg)   LMP 07/23/2023 (Approximate) Comment: spotting  SpO2 98%   BMI 34.01 kg/m   Physical Exam Vitals and nursing note reviewed.  Constitutional:      Appearance: Normal appearance. She is obese.  HENT:     Head: Normocephalic and atraumatic.     Nose: Nose normal.  Pulmonary:     Effort: Pulmonary effort is normal.  Musculoskeletal:        General: Normal range of motion.     Cervical back: Normal range of motion and neck supple.  Skin:    General: Skin is warm and dry.  Neurological:     General: No focal deficit present.     Mental Status: She is alert and oriented to person, place, and time.         Assessment & Plan:  1. Type 2 diabetes mellitus with hyperglycemia, without long-term current use of insulin (HCC)  - metFORMIN (GLUCOPHAGE) 500 MG tablet; Take 500 mg by mouth 2 (two) times daily with a meal. - Amb Referral to Nutrition and Diabetic Education - Microalbumin / creatinine urine ratio - POCT Glucose (CBG)  2. Acute cystitis without hematuria  - Urine Culture - May stop Cipro for now  3. Blurred vision  - I think this is likely related to blood sugars,  I recommend getting in with her eye doctor for a diabetic eye exam - May continue Restasis  4. Primary hypertension  - valsartan (DIOVAN) 80 MG tablet; Take 80 mg by mouth daily. - Continue hydrochlorothiazide - Take your blood pressures at home once daily, preferably in the  mornings.  Follow up with me in a month with your readings for further evaluation.      Return in about 4 weeks (around 10/03/2023) for f/u for BP.   Moshe Cipro, FNP

## 2023-09-05 NOTE — Patient Instructions (Addendum)
American Diabetes Association website  Metformin 500mg  one tab with breakfast and one tab with dinner for 2 weeks  Metformin 500mg  one tab with breakfast, and 2 tabs with dinner for 2 weeks, then  Metformin 500mg  2 tabs with breakfast and 2 tabs with dinner daily  Stop the Cipro  Take your blood pressures at home once daily, preferably in the mornings.  Follow up with me in a month with your readings for further evaluation.

## 2023-09-06 LAB — URINE CULTURE: Result:: NO GROWTH

## 2023-09-11 ENCOUNTER — Telehealth: Payer: Self-pay | Admitting: Family Medicine

## 2023-09-11 DIAGNOSIS — I10 Essential (primary) hypertension: Secondary | ICD-10-CM

## 2023-09-11 NOTE — Telephone Encounter (Signed)
Prescription Request  09/11/2023  LOV: 09/05/2023  What is the name of the medication or equipment? Valsartan, hydrochlorothizide  Have you contacted your pharmacy to request a refill? Yes   Which pharmacy would you like this sent to?   [id number] Ros[place] Levan, [place] - [address] AT [facility] [address] [place] [address] Phone: [phone] Fax: [phone]    Patient notified that their request is being sent to the clinical staff for review and that they should receive a response within 2 business days.   Please advise at Mobile [phone] (mobile)

## 2023-09-12 ENCOUNTER — Other Ambulatory Visit: Payer: Self-pay

## 2023-09-13 ENCOUNTER — Other Ambulatory Visit: Payer: Self-pay

## 2023-09-13 DIAGNOSIS — I10 Essential (primary) hypertension: Secondary | ICD-10-CM

## 2023-09-13 NOTE — Telephone Encounter (Signed)
Attempted to reach out to patient but could not LVM due to voice mail box being full. Wanted to inform PT that their PCP is out today and that we wouldn't be able to address this until the morning. Also wanted to check to see how much of each medication she had left.  Can not put refill in myself due to it being last filled by a historical provider and not current PCP. Would be able to refill it after getting an OK from [facility] FNP

## 2023-09-13 NOTE — Telephone Encounter (Signed)
Patient called again today and states she is almost completely out

## 2023-09-13 NOTE — Telephone Encounter (Signed)
Patient returned Zack's call and I relayed the message he left regarding PCP being out of the office today. She conveyed understanding and is okay with it being taken care of tomorrow. Best callback is [phone].

## 2023-09-14 MED ORDER — VALSARTAN 80 MG PO TABS: 80.0000 mg | ORAL_TABLET | Freq: Every day | ORAL | 2 refills | Status: DC

## 2023-09-14 MED ORDER — HYDROCHLOROTHIAZIDE 12.5 MG PO TABS: 12.5000 mg | ORAL_TABLET | Freq: Every day | ORAL | 2 refills | Status: DC

## 2023-09-14 NOTE — Telephone Encounter (Signed)
Refills sent to pharmacy. 

## 2023-09-15 DIAGNOSIS — I10 Essential (primary) hypertension: Secondary | ICD-10-CM | POA: Diagnosis not present

## 2023-09-25 ENCOUNTER — Telehealth: Payer: Self-pay | Admitting: Family Medicine

## 2023-09-25 DIAGNOSIS — E1165 Type 2 diabetes mellitus with hyperglycemia: Secondary | ICD-10-CM

## 2023-09-25 MED ORDER — METFORMIN HCL 500 MG PO TABS
500.0000 mg | ORAL_TABLET | Freq: Two times a day (BID) | ORAL | 0 refills | Status: DC
Start: 2023-09-25 — End: 2023-10-05

## 2023-09-25 NOTE — Telephone Encounter (Signed)
Prescription Request  09/25/2023  LOV: 09/05/2023  What is the name of the medication or equipment? Metformin 500  Have you contacted your pharmacy to request a refill? Yes   Which pharmacy would you like this sent to?  [id number], [place] - [address] [place] [address] Phone: [phone] Fax: [phone]     Patient notified that their request is being sent to the clinical staff for review and that they should receive a response within 2 business days.   Please advise at Mobile [phone] (mobile)

## 2023-10-05 ENCOUNTER — Ambulatory Visit (INDEPENDENT_AMBULATORY_CARE_PROVIDER_SITE_OTHER): Payer: 59 | Admitting: Family Medicine

## 2023-10-05 ENCOUNTER — Encounter: Payer: Self-pay | Admitting: Family Medicine

## 2023-10-05 VITALS — BP 140/98 | HR 83 | Temp 98.4°F | Ht 63.0 in | Wt 190.8 lb

## 2023-10-05 DIAGNOSIS — E1165 Type 2 diabetes mellitus with hyperglycemia: Secondary | ICD-10-CM | POA: Diagnosis not present

## 2023-10-05 DIAGNOSIS — Z7984 Long term (current) use of oral hypoglycemic drugs: Secondary | ICD-10-CM

## 2023-10-05 DIAGNOSIS — E66811 Obesity, class 1: Secondary | ICD-10-CM | POA: Diagnosis not present

## 2023-10-05 DIAGNOSIS — Z79899 Other long term (current) drug therapy: Secondary | ICD-10-CM

## 2023-10-05 DIAGNOSIS — I10 Essential (primary) hypertension: Secondary | ICD-10-CM | POA: Diagnosis not present

## 2023-10-05 LAB — POCT GLUCOSE (DEVICE FOR HOME USE): POC Glucose: 100 mg/dL — AB (ref 70–99)

## 2023-10-05 MED ORDER — METFORMIN HCL 500 MG PO TABS
1000.0000 mg | ORAL_TABLET | Freq: Two times a day (BID) | ORAL | 1 refills | Status: DC
Start: 2023-10-05 — End: 2023-10-26

## 2023-10-05 MED ORDER — HYDROCHLOROTHIAZIDE 12.5 MG PO TABS: 25.0000 mg | ORAL_TABLET | Freq: Every day | ORAL | 2 refills | Status: DC

## 2023-10-05 MED ORDER — LANCET DEVICE MISC
1.0000 | Freq: Three times a day (TID) | 0 refills | Status: AC
Start: 2023-10-05 — End: 2023-11-04

## 2023-10-05 MED ORDER — LANCETS MISC. MISC
1.0000 | Freq: Three times a day (TID) | 0 refills | Status: AC
Start: 2023-10-05 — End: 2023-11-04

## 2023-10-05 MED ORDER — BLOOD GLUCOSE MONITORING SUPPL DEVI
1.0000 | Freq: Three times a day (TID) | 0 refills | Status: AC
Start: 2023-10-05 — End: ?

## 2023-10-05 MED ORDER — BLOOD GLUCOSE TEST VI STRP
1.0000 | ORAL_STRIP | Freq: Three times a day (TID) | 0 refills | Status: AC
Start: 2023-10-05 — End: 2023-11-04

## 2023-10-05 NOTE — Patient Instructions (Addendum)
American Diabetes Association website will have lots of information and recipes to help stabilize blood sugars.  I have attached some information on diet plans to help stabilize blood pressures and blood sugars.  Florastor probiotic.  Will increase hydrochlorothiazide to 25mg  once daily.  Berberine, beet root, and cinnamon   Increase metformin to 2 tablets twice a day with food.

## 2023-10-05 NOTE — Progress Notes (Signed)
140  Established Patient Office Visit  Subjective   Patient ID: Jessica Henry, female    DOB: 1965/12/30  Age: 57 y.o. MRN: [id number]  Chief Complaint  Patient presents with   Hypertension    4 Week Follow Up regarding hypertension. Patient notes checking blood pressure on each arm at least once a day (twice a day when patient is off). BP at lowest being 137/80 and highest up to 150 in systolic.    Blood Sugar Problem    PT wants to know what glucometer they would be suggested    HPI 57 year old female presents for reevaluation of blood pressure and type 2 diabetes. Has been taking metformin 500 mg 1 tablet twice a day with no issues for the last month. Has been taking Diovan 80 mg, as well as HCTZ 12.5 mg at home for the last month as well with no issues. States blood pressures at home have been in the 140s over 80s. Denies any symptomatic high or low blood pressures or blood sugars. States that headaches and blurred vision have resolved since last visit. She is requesting a blood sugar check today, she has had a banana and her medications this morning. Denies chest pain, shortness of breath, numbness, tingling, other symptoms.    ROS Per HPI    Objective:     BP (!) 140/98   Pulse 83   Temp 98.4 F (36.9 C) (Oral)   Ht 5\' 3"  (1.6 m)   Wt 190 lb 12.8 oz (86.5 kg)   SpO2 99%   BMI 33.80 kg/m   Physical Exam Vitals and nursing note reviewed.  Constitutional:      General: She is not in acute distress.    Appearance: Normal appearance. She is obese.  HENT:     Head: Normocephalic and atraumatic.  Eyes:     Extraocular Movements: Extraocular movements intact.     Pupils: Pupils are equal, round, and reactive to light.  Neck:     Vascular: No carotid bruit.  Cardiovascular:     Rate and Rhythm: Normal rate and regular rhythm.     Heart sounds: Normal heart sounds.  Pulmonary:     Effort: Pulmonary effort is normal.  Musculoskeletal:        General: Normal  range of motion.     Cervical back: Normal range of motion.  Lymphadenopathy:     Cervical: No cervical adenopathy.  Skin:    General: Skin is warm and dry.     Capillary Refill: Capillary refill takes less than 2 seconds.  Neurological:     General: No focal deficit present.     Mental Status: She is alert and oriented to person, place, and time.  Psychiatric:        Mood and Affect: Mood normal.        Thought Content: Thought content normal.      No results found for any visits on 10/05/23.   The 10-year ASCVD risk score (Arnett DK, et al., 2019) is: 15.6%    Assessment & Plan:   Type 2 diabetes mellitus with hyperglycemia, without long-term current use of insulin (HCC) -     metFORMIN HCl; Take 2 tablets (1,000 mg total) by mouth 2 (two) times daily with a meal.  Dispense: 360 tablet; Refill: 1 -     POCT Glucose (Device for Home Use) -     Blood Glucose Monitoring Suppl; 1 each by Does not apply route in the morning, at noon,  and at bedtime. May substitute to any manufacturer covered by patient's insurance.  Dispense: 1 each; Refill: 0 -     Blood Glucose Test; 1 each by In Vitro route in the morning, at noon, and at bedtime. May substitute to any manufacturer covered by patient's insurance.  Dispense: 100 strip; Refill: 0 -     Lancet Device; 1 each by Does not apply route in the morning, at noon, and at bedtime. May substitute to any manufacturer covered by patient's insurance.  Dispense: 1 each; Refill: 0 -     Lancets Misc.; 1 each by Does not apply route in the morning, at noon, and at bedtime. May substitute to any manufacturer covered by patient's insurance.  Dispense: 100 each; Refill: 0  Primary hypertension -     hydroCHLOROthiazide; Take 2 tablets (25 mg total) by mouth daily.  Dispense: 90 tablet; Refill: 2  Obesity (BMI 30.0-34.9)  Medication management  Increase metformin 500 mg to  2 tablets twice a day with food   Increase HCTZ to 25 mg once daily in the  mornings  POC blood sugar 100  Instructions given on how to check blood sugars at home.  Keep a log with you and bring them to your next visit  Edu printed and given on diabetes diet, DASH diet. Discussed healthy activity level  Return in about 2 months (around 12/06/2023) for fasting labs, med check.    Moshe Cipro, FNP

## 2023-10-26 ENCOUNTER — Telehealth: Payer: Self-pay | Admitting: Family Medicine

## 2023-10-26 DIAGNOSIS — E1165 Type 2 diabetes mellitus with hyperglycemia: Secondary | ICD-10-CM

## 2023-10-26 MED ORDER — METFORMIN HCL 500 MG PO TABS
1000.0000 mg | ORAL_TABLET | Freq: Two times a day (BID) | ORAL | 1 refills | Status: DC
Start: 2023-10-26 — End: 2023-12-07

## 2023-10-26 NOTE — Addendum Note (Signed)
Addended by: Sherald Barge on: 10/26/2023 04:32 PM   Modules accepted: Orders

## 2023-10-26 NOTE — Telephone Encounter (Signed)
Copied from CRM [id number]. Topic: Clinical - Prescription Issue >> Oct 26, 2023  1:31 PM Corin V wrote: Reason for CRM: Patient called and stated the pharmacy does not have the script for her metFORMIN (GLUCOPHAGE) 500 MG tablet. Epic indicates it was sent on 10/05/23. Patient stated they filled all other presctiptions sent that day but not the metformin. Please resend Rx as she has only 2 pills left.

## 2023-11-01 ENCOUNTER — Ambulatory Visit: Payer: 59 | Admitting: Skilled Nursing Facility1

## 2023-12-07 ENCOUNTER — Ambulatory Visit (INDEPENDENT_AMBULATORY_CARE_PROVIDER_SITE_OTHER): Payer: 59 | Admitting: Family Medicine

## 2023-12-07 ENCOUNTER — Encounter: Payer: Self-pay | Admitting: Family Medicine

## 2023-12-07 VITALS — BP 158/90 | HR 81 | Temp 98.0°F | Ht 63.0 in | Wt 188.6 lb

## 2023-12-07 DIAGNOSIS — I10 Essential (primary) hypertension: Secondary | ICD-10-CM

## 2023-12-07 DIAGNOSIS — Z79899 Other long term (current) drug therapy: Secondary | ICD-10-CM

## 2023-12-07 DIAGNOSIS — E559 Vitamin D deficiency, unspecified: Secondary | ICD-10-CM

## 2023-12-07 DIAGNOSIS — Z7984 Long term (current) use of oral hypoglycemic drugs: Secondary | ICD-10-CM

## 2023-12-07 DIAGNOSIS — E782 Mixed hyperlipidemia: Secondary | ICD-10-CM

## 2023-12-07 DIAGNOSIS — E1165 Type 2 diabetes mellitus with hyperglycemia: Secondary | ICD-10-CM

## 2023-12-07 LAB — CBC WITH DIFFERENTIAL/PLATELET
Basophils Absolute: 0 10*3/uL (ref 0.0–0.1)
Basophils Relative: 0.3 % (ref 0.0–3.0)
Eosinophils Absolute: 0.1 10*3/uL (ref 0.0–0.7)
Eosinophils Relative: 1.3 % (ref 0.0–5.0)
HCT: 37 % (ref 36.0–46.0)
Hemoglobin: 12 g/dL (ref 12.0–15.0)
Lymphocytes Relative: 25.5 % (ref 12.0–46.0)
Lymphs Abs: 2.3 10*3/uL (ref 0.7–4.0)
MCHC: 32.5 g/dL (ref 30.0–36.0)
MCV: 81.4 fl (ref 78.0–100.0)
Monocytes Absolute: 0.6 10*3/uL (ref 0.1–1.0)
Monocytes Relative: 6.3 % (ref 3.0–12.0)
Neutro Abs: 6.1 10*3/uL (ref 1.4–7.7)
Neutrophils Relative %: 66.6 % (ref 43.0–77.0)
Platelets: 297 10*3/uL (ref 150.0–400.0)
RBC: 4.55 Mil/uL (ref 3.87–5.11)
RDW: 14.5 % (ref 11.5–15.5)
WBC: 9.2 10*3/uL (ref 4.0–10.5)

## 2023-12-07 LAB — COMPREHENSIVE METABOLIC PANEL
ALT: 12 U/L (ref 0–35)
AST: 13 U/L (ref 0–37)
Albumin: 4.3 g/dL (ref 3.5–5.2)
Alkaline Phosphatase: 60 U/L (ref 39–117)
BUN: 13 mg/dL (ref 6–23)
CO2: 31 meq/L (ref 19–32)
Calcium: 9.5 mg/dL (ref 8.4–10.5)
Chloride: 103 meq/L (ref 96–112)
Creatinine, Ser: 0.57 mg/dL (ref 0.40–1.20)
GFR: 102.11 mL/min (ref >60.00)
Glucose, Bld: 87 mg/dL (ref 70–99)
Potassium: 3.6 meq/L (ref 3.5–5.1)
Sodium: 142 meq/L (ref 135–145)
Total Bilirubin: 0.6 mg/dL (ref 0.2–1.2)
Total Protein: 7.4 g/dL (ref 6.0–8.3)

## 2023-12-07 LAB — LIPID PANEL
Cholesterol: 144 mg/dL (ref 0–200)
HDL: 48.9 mg/dL (ref >39.00)
LDL Cholesterol: 83 mg/dL (ref 0–99)
NonHDL: 95.3
Total CHOL/HDL Ratio: 3
Triglycerides: 60 mg/dL (ref 0.0–149.0)
VLDL: 12 mg/dL (ref 0.0–40.0)

## 2023-12-07 LAB — HEMOGLOBIN A1C: Hgb A1c MFr Bld: 6.4 % (ref 4.6–6.5)

## 2023-12-07 LAB — VITAMIN D 25 HYDROXY (VIT D DEFICIENCY, FRACTURES): VITD: 28.21 ng/mL — ABNORMAL LOW (ref 30.00–100.00)

## 2023-12-07 MED ORDER — VALSARTAN 80 MG PO TABS: 80.0000 mg | ORAL_TABLET | Freq: Every day | ORAL | 2 refills | Status: DC

## 2023-12-07 MED ORDER — METFORMIN HCL 500 MG PO TABS
1000.0000 mg | ORAL_TABLET | Freq: Two times a day (BID) | ORAL | 1 refills | Status: DC
Start: 2023-12-07 — End: 2024-01-04

## 2023-12-07 MED ORDER — HYDROCHLOROTHIAZIDE 25 MG PO TABS
25.0000 mg | ORAL_TABLET | Freq: Every day | ORAL | 3 refills | Status: DC
Start: 2023-12-07 — End: 2024-07-04

## 2023-12-07 NOTE — Patient Instructions (Addendum)
 We are checking labs today, will be in contact with any results that require further attention  Take both blood pressure medications together in the mornings.  Look at [facility] [place] [place])  Follow up with me in about a month to recheck blood pressure and evaluate for medication effectiveness.

## 2023-12-07 NOTE — Progress Notes (Signed)
 Established Patient Office Visit  Subjective   Patient ID: Jessica Henry, female    DOB: 05-Feb-1966  Age: 58 y.o. MRN: [id number]  No chief complaint on file.   HPI Patient presents today for medication management. Reports compliance with medication regimen. Requesting refills of hydrochlorothiazide 25 mg, states that she only received the 12.5 mg once daily from the pharmacy. Takes HCTZ in the morning and then takes Diovan at night. Blood pressures have still been in the 140s over 90s at home Blood sugars nonfasting has been around 90-100 at home. Fasting today. Denies other concerns. Medical history as outlined below.  ROS Per HPI    Objective:     BP (!) 158/90   Pulse 81   Temp 98 F (36.7 C) (Temporal)   Ht 5\' 3"  (1.6 m)   Wt 188 lb 9.6 oz (85.5 kg)   SpO2 99%   BMI 33.41 kg/m   Physical Exam Vitals and nursing note reviewed.  Constitutional:      General: She is not in acute distress.    Appearance: Normal appearance. She is normal weight.  HENT:     Head: Normocephalic and atraumatic.     Nose: Nose normal.  Eyes:     Extraocular Movements: Extraocular movements intact.     Pupils: Pupils are equal, round, and reactive to light.  Cardiovascular:     Rate and Rhythm: Normal rate and regular rhythm.     Heart sounds: Normal heart sounds. No murmur heard. Pulmonary:     Effort: Pulmonary effort is normal. No respiratory distress.     Breath sounds: Normal breath sounds. No wheezing, rhonchi or rales.  Musculoskeletal:        General: Normal range of motion.     Cervical back: Normal range of motion.  Lymphadenopathy:     Cervical: No cervical adenopathy.  Neurological:     General: No focal deficit present.     Mental Status: She is alert and oriented to person, place, and time.  Psychiatric:        Mood and Affect: Mood normal.        Thought Content: Thought content normal.     No results found for any visits on 12/07/23.   The 10-year  ASCVD risk score (Arnett DK, et al., 2019) is: 22.9%    Assessment & Plan:   Type 2 diabetes mellitus with hyperglycemia, without long-term current use of insulin (HCC) -     Comprehensive metabolic panel -     Hemoglobin A1c -     metFORMIN HCl; Take 2 tablets (1,000 mg total) by mouth 2 (two) times daily with a meal.  Dispense: 360 tablet; Refill: 1  Primary hypertension -     CBC with Differential/Platelet -     Comprehensive metabolic panel -     hydroCHLOROthiazide; Take 1 tablet (25 mg total) by mouth daily.  Dispense: 90 tablet; Refill: 3 -     Valsartan; Take 1 tablet (80 mg total) by mouth daily.  Dispense: 90 tablet; Refill: 2  Medication management -     CBC with Differential/Platelet -     Comprehensive metabolic panel -     Hemoglobin A1c -     Lipid panel -     VITAMIN D 25 Hydroxy (Vit-D Deficiency, Fractures)  Vitamin D deficiency -     VITAMIN D 25 Hydroxy (Vit-D Deficiency, Fractures)  Mixed hyperlipidemia -     Lipid panel   Take Diovan  and HCTZ together in the mornings, then continue to monitor blood pressures Follow-up with me in a month to recheck  Return in about 4 weeks (around 01/04/2024) for BP,  meds.    Moshe Cipro, FNP

## 2023-12-10 MED ORDER — VITAMIN D (ERGOCALCIFEROL) 1.25 MG (50000 UNIT) PO CAPS
50000.0000 [IU] | ORAL_CAPSULE | ORAL | 0 refills | Status: DC
Start: 2023-12-10 — End: 2024-01-30

## 2023-12-10 NOTE — Addendum Note (Signed)
 Addended by: Sherald Barge on: 12/10/2023 10:33 AM   Modules accepted: Orders

## 2023-12-12 ENCOUNTER — Ambulatory Visit: Payer: 59 | Admitting: Skilled Nursing Facility1

## 2024-01-04 ENCOUNTER — Encounter: Payer: Self-pay | Admitting: Family Medicine

## 2024-01-04 ENCOUNTER — Ambulatory Visit (INDEPENDENT_AMBULATORY_CARE_PROVIDER_SITE_OTHER): Payer: 59 | Admitting: Family Medicine

## 2024-01-04 VITALS — BP 140/88 | HR 81 | Temp 98.8°F | Ht 63.0 in

## 2024-01-04 DIAGNOSIS — I10 Essential (primary) hypertension: Secondary | ICD-10-CM

## 2024-01-04 DIAGNOSIS — Z79899 Other long term (current) drug therapy: Secondary | ICD-10-CM

## 2024-01-04 DIAGNOSIS — E1165 Type 2 diabetes mellitus with hyperglycemia: Secondary | ICD-10-CM | POA: Diagnosis not present

## 2024-01-04 DIAGNOSIS — M722 Plantar fascial fibromatosis: Secondary | ICD-10-CM

## 2024-01-04 DIAGNOSIS — Z7984 Long term (current) use of oral hypoglycemic drugs: Secondary | ICD-10-CM

## 2024-01-04 MED ORDER — METFORMIN HCL 500 MG PO TABS: 500.0000 mg | ORAL_TABLET | Freq: Two times a day (BID) | ORAL | 1 refills | Status: DC

## 2024-01-04 NOTE — Patient Instructions (Signed)
 Jessica Henry  Follow up with me in 2 months for meds and labs

## 2024-01-04 NOTE — Assessment & Plan Note (Signed)
 Continue to check blood pressures at home 3-4 times a week. Continue valsartan and HCTZ.

## 2024-01-04 NOTE — Assessment & Plan Note (Signed)
 Continue current medication regimen, will recheck labs at next visit

## 2024-01-04 NOTE — Progress Notes (Signed)
   Established Patient Office Visit  Subjective   Patient ID: Jessica Henry, female    DOB: Feb 17, 1966  Age: 58 y.o. MRN: [id number]  Chief Complaint  Patient presents with   BP f/u    HPI Patient presents today for medication management and re evaluation of HTN. Reports compliance with medication regimen. Denies the need for refills. Not fasting today. Reports R heel pain and hx spurs in the R heel. Reports pain when she first puts her feet on the floor at home.  Does not go barefoot at home.  Not currently treating at home.  She does have a history of plantar vasculitis Denies other concerns today. Medical history as outlined below.  ROS Per HPI    Objective:     BP (!) 140/88 (BP Location: Left Arm, Patient Position: Sitting)   Pulse 81   Temp 98.8 F (37.1 C) (Temporal)   Ht 5\' 3"  (1.6 m)   SpO2 98%   BMI 33.41 kg/m   Physical Exam Vitals and nursing note reviewed.  Constitutional:      General: She is not in acute distress.    Appearance: Normal appearance. She is obese.  HENT:     Head: Normocephalic and atraumatic.     Right Ear: External ear normal.     Left Ear: External ear normal.     Nose: Nose normal.  Eyes:     Extraocular Movements: Extraocular movements intact.  Cardiovascular:     Rate and Rhythm: Normal rate and regular rhythm.     Pulses: Normal pulses.     Heart sounds: Normal heart sounds.  Pulmonary:     Effort: Pulmonary effort is normal. No respiratory distress.     Breath sounds: Normal breath sounds. No wheezing, rhonchi or rales.  Musculoskeletal:        General: Normal range of motion.     Cervical back: Normal range of motion.     Right lower leg: No edema.     Left lower leg: No edema.  Lymphadenopathy:     Cervical: No cervical adenopathy.  Neurological:     General: No focal deficit present.     Mental Status: She is alert and oriented to person, place, and time.  Psychiatric:        Mood and Affect: Mood normal.         Thought Content: Thought content normal.     No results found for any visits on 01/04/24.   The 10-year ASCVD risk score (Arnett DK, et al., 2019) is: 12.8%    Assessment & Plan:   Primary hypertension Assessment & Plan: Continue to check blood pressures at home 3-4 times a week. Continue valsartan and HCTZ.   Medication management Assessment & Plan: Continue current medication regimen, will recheck labs at next visit   Type 2 diabetes mellitus with hyperglycemia, without long-term current use of insulin (HCC) Assessment & Plan: Continue current dietary changes and healthy activity level May decrease metformin to 500 mg 1 tablet twice a day    Orders: -     metFORMIN HCl; Take 1 tablet (500 mg total) by mouth 2 (two) times daily with a meal.  Dispense: 180 tablet; Refill: 1  Plantar fasciitis of right foot  Rehab handout given.  F/u sooner than 2 mos if symptoms are not improving    Return in about 2 months (around 03/05/2024) for meds, labs.    Sherald Barge, FNP

## 2024-01-04 NOTE — Assessment & Plan Note (Signed)
 Continue current dietary changes and healthy activity level May decrease metformin to 500 mg 1 tablet twice a day

## 2024-01-29 ENCOUNTER — Other Ambulatory Visit: Payer: Self-pay | Admitting: Family Medicine

## 2024-01-29 DIAGNOSIS — E559 Vitamin D deficiency, unspecified: Secondary | ICD-10-CM

## 2024-03-07 ENCOUNTER — Ambulatory Visit: Admitting: Family Medicine

## 2024-03-07 ENCOUNTER — Other Ambulatory Visit: Payer: Self-pay | Admitting: Family Medicine

## 2024-03-07 ENCOUNTER — Other Ambulatory Visit

## 2024-03-07 DIAGNOSIS — E559 Vitamin D deficiency, unspecified: Secondary | ICD-10-CM | POA: Diagnosis not present

## 2024-03-07 DIAGNOSIS — E782 Mixed hyperlipidemia: Secondary | ICD-10-CM

## 2024-03-07 DIAGNOSIS — I10 Essential (primary) hypertension: Secondary | ICD-10-CM

## 2024-03-07 DIAGNOSIS — E1165 Type 2 diabetes mellitus with hyperglycemia: Secondary | ICD-10-CM

## 2024-03-07 DIAGNOSIS — Z79899 Other long term (current) drug therapy: Secondary | ICD-10-CM

## 2024-03-07 LAB — CBC WITH DIFFERENTIAL/PLATELET
Basophils Absolute: 0 10*3/uL (ref 0.0–0.1)
Basophils Relative: 0.3 % (ref 0.0–3.0)
Eosinophils Absolute: 0.1 10*3/uL (ref 0.0–0.7)
Eosinophils Relative: 1.2 % (ref 0.0–5.0)
HCT: 36.3 % (ref 36.0–46.0)
Hemoglobin: 11.8 g/dL — ABNORMAL LOW (ref 12.0–15.0)
Lymphocytes Relative: 29.7 % (ref 12.0–46.0)
Lymphs Abs: 2.6 10*3/uL (ref 0.7–4.0)
MCHC: 32.5 g/dL (ref 30.0–36.0)
MCV: 78.6 fl (ref 78.0–100.0)
Monocytes Absolute: 0.5 10*3/uL (ref 0.1–1.0)
Monocytes Relative: 5.6 % (ref 3.0–12.0)
Neutro Abs: 5.4 10*3/uL (ref 1.4–7.7)
Neutrophils Relative %: 63.2 % (ref 43.0–77.0)
Platelets: 306 10*3/uL (ref 150.0–400.0)
RBC: 4.62 Mil/uL (ref 3.87–5.11)
RDW: 14.5 % (ref 11.5–15.5)
WBC: 8.6 10*3/uL (ref 4.0–10.5)

## 2024-03-07 LAB — COMPREHENSIVE METABOLIC PANEL WITH GFR
ALT: 11 U/L (ref 0–35)
AST: 13 U/L (ref 0–37)
Albumin: 4.5 g/dL (ref 3.5–5.2)
Alkaline Phosphatase: 71 U/L (ref 39–117)
BUN: 12 mg/dL (ref 6–23)
CO2: 30 meq/L (ref 19–32)
Calcium: 9.6 mg/dL (ref 8.4–10.5)
Chloride: 102 meq/L (ref 96–112)
Creatinine, Ser: 0.62 mg/dL (ref 0.40–1.20)
GFR: 99.89 mL/min (ref >60.00)
Glucose, Bld: 85 mg/dL (ref 70–99)
Potassium: 3.5 meq/L (ref 3.5–5.1)
Sodium: 140 meq/L (ref 135–145)
Total Bilirubin: 0.3 mg/dL (ref 0.2–1.2)
Total Protein: 7.7 g/dL (ref 6.0–8.3)

## 2024-03-07 LAB — LIPID PANEL
Cholesterol: 158 mg/dL (ref 0–200)
HDL: 53 mg/dL (ref >39.00)
LDL Cholesterol: 87 mg/dL (ref 0–99)
NonHDL: 105.06
Total CHOL/HDL Ratio: 3
Triglycerides: 91 mg/dL (ref 0.0–149.0)
VLDL: 18.2 mg/dL (ref 0.0–40.0)

## 2024-03-07 LAB — HEMOGLOBIN A1C: Hgb A1c MFr Bld: 6 % (ref 4.6–6.5)

## 2024-03-07 LAB — MICROALBUMIN / CREATININE URINE RATIO
Creatinine,U: 61.8 mg/dL
Microalb Creat Ratio: 23.7 mg/g (ref 0.0–30.0)
Microalb, Ur: 1.5 mg/dL (ref 0.0–1.9)

## 2024-03-07 LAB — VITAMIN D 25 HYDROXY (VIT D DEFICIENCY, FRACTURES): VITD: 33.12 ng/mL (ref 30.00–100.00)

## 2024-03-07 NOTE — Progress Notes (Deleted)
   Established Patient Office Visit  Subjective   Patient ID: Jessica Henry, female    DOB: 02/13/1966  Age: 58 y.o. MRN: [id number]  No chief complaint on file.   HPI Patient presents today for medication management. Reports compliance with medication regimen. Denies the need for refills. Not fasting today. Denies other concerns. Medical history as outlined below.  ROS Per HPI    Objective:     There were no vitals taken for this visit.  Physical Exam Vitals and nursing note reviewed.  Constitutional:      General: She is not in acute distress.    Appearance: Normal appearance. She is obese.  HENT:     Head: Normocephalic and atraumatic.     Right Ear: External ear normal.     Left Ear: External ear normal.     Nose: Nose normal.  Eyes:     Extraocular Movements: Extraocular movements intact.  Cardiovascular:     Rate and Rhythm: Normal rate and regular rhythm.     Pulses: Normal pulses.     Heart sounds: Normal heart sounds.  Pulmonary:     Effort: Pulmonary effort is normal. No respiratory distress.     Breath sounds: Normal breath sounds. No wheezing, rhonchi or rales.  Musculoskeletal:        General: Normal range of motion.     Cervical back: Normal range of motion.     Right lower leg: No edema.     Left lower leg: No edema.  Lymphadenopathy:     Cervical: No cervical adenopathy.  Neurological:     General: No focal deficit present.     Mental Status: She is alert and oriented to person, place, and time.  Psychiatric:        Mood and Affect: Mood normal.        Thought Content: Thought content normal.      No results found for any visits on 03/07/24.   The 10-year ASCVD risk score (Arnett DK, et al., 2019) is: 12.8%    Assessment & Plan:   Primary hypertension  Type 2 diabetes mellitus with hyperglycemia, without long-term current use of insulin (HCC)  Herpes simplex vulvovaginitis     No follow-ups on file.    Wellington Half, FNP

## 2024-03-09 ENCOUNTER — Ambulatory Visit: Payer: Self-pay | Admitting: Family Medicine

## 2024-03-16 ENCOUNTER — Other Ambulatory Visit: Payer: Self-pay | Admitting: Family Medicine

## 2024-03-16 DIAGNOSIS — E559 Vitamin D deficiency, unspecified: Secondary | ICD-10-CM

## 2024-03-22 DIAGNOSIS — E782 Mixed hyperlipidemia: Secondary | ICD-10-CM | POA: Insufficient documentation

## 2024-03-22 DIAGNOSIS — E559 Vitamin D deficiency, unspecified: Secondary | ICD-10-CM | POA: Insufficient documentation

## 2024-03-22 NOTE — Progress Notes (Signed)
 Established Patient Office Visit  Subjective   Patient ID: Jessica Henry, female    DOB: [birth year]  Age: 58 y.o. MRN: [id number]  Chief Complaint  Patient presents with   Follow-up    HPI Patient presents today for medication management. Reports compliance with medication regimen. States that she is taking HCTZ in the morning and valsartan  at night.  States that blood pressures are still little bit elevated.  Denies symptomatic hypertension including headaches, vision changes, dizziness, fatigue, numbness, tingling, other concerns. Has previously had labs drawn last month. Medical history as outlined below.  Reports left lateral thigh pain and aching intermittently.  Reports old knee injury and thinks that she is just flaring up with that.  Reports history of sebaceous cyst to her chest.  Inquiring about possible referral to surgery to have it removed.  Has had to have the area drained in the past.  ROS Per HPI    Objective:     BP 138/88 (BP Location: Left Arm, Patient Position: Sitting, Cuff Size: Normal)   Pulse 72   Temp 98.3 F (36.8 C) (Temporal)   Ht 5' 3 (1.6 m)   Wt 187 lb 9.6 oz (85.1 kg)   SpO2 98%   BMI 33.23 kg/m   Physical Exam Vitals and nursing note reviewed.  Constitutional:      General: She is not in acute distress.    Appearance: Normal appearance. She is obese.  HENT:     Head: Normocephalic and atraumatic.     Right Ear: External ear normal.     Left Ear: External ear normal.     Nose: Nose normal.     Mouth/Throat:     Mouth: Mucous membranes are moist.     Pharynx: Oropharynx is clear.   Eyes:     Extraocular Movements: Extraocular movements intact.     Pupils: Pupils are equal, round, and reactive to light.    Cardiovascular:     Rate and Rhythm: Normal rate and regular rhythm.     Pulses: Normal pulses.     Heart sounds: Normal heart sounds.  Pulmonary:     Effort: Pulmonary effort is normal. No respiratory distress.      Breath sounds: Normal breath sounds. No wheezing, rhonchi or rales.   Musculoskeletal:        General: Normal range of motion.     Cervical back: Normal range of motion.     Right lower leg: No edema.     Left lower leg: No edema.  Lymphadenopathy:     Cervical: No cervical adenopathy.   Neurological:     General: No focal deficit present.     Mental Status: She is alert and oriented to person, place, and time.   Psychiatric:        Mood and Affect: Mood normal.        Thought Content: Thought content normal.      No results found for any visits on 03/31/24.   The 10-year ASCVD risk score (Arnett DK, et al., 2019) is: 13%    Assessment & Plan:   Type 2 diabetes mellitus with hyperglycemia, without long-term current use of insulin (HCC) Assessment & Plan: Controlled, A1c 6.0 Continue metformin  regimen Continue efforts in low sweets, low-carb diet and healthy activity level   Primary hypertension Assessment & Plan: Not well-controlled, discussed taking HCTZ and valsartan  together at the same time in the mornings Continue to monitor blood pressures at home Continue efforts and low-salt  diet Continue efforts and exercise   Orders: -     Valsartan ; Take 1 tablet (80 mg total) by mouth daily.  Dispense: 90 tablet; Refill: 1  Class 1 obesity due to excess calories with serious comorbidity and body mass index (BMI) of 33.0 to 33.9 in adult Assessment & Plan: Continue efforts in healthy diet and activity level   Vitamin D  deficiency Assessment & Plan: [place] out prescription strength vitamin D , and then go to vitamin D  2000 units once daily from over-the-counter.   Mixed hyperlipidemia Assessment & Plan: Controlled, continue current regimen, continue efforts and low-fat diet   Left leg pain Assessment & Plan: IT band exercises given   Sebaceous cyst Assessment & Plan: Will consider referral if cyst returns   Medication management Assessment &  Plan: Continue current medication regimen, no dose adjustments needed today Labs reviewed with patient today      Return in about 3 months (around 07/01/2024) for meds.    Corean LITTIE Ku, FNP

## 2024-03-31 ENCOUNTER — Ambulatory Visit (INDEPENDENT_AMBULATORY_CARE_PROVIDER_SITE_OTHER): Admitting: Family Medicine

## 2024-03-31 ENCOUNTER — Encounter: Payer: Self-pay | Admitting: Family Medicine

## 2024-03-31 VITALS — BP 138/88 | HR 72 | Temp 98.3°F | Ht 63.0 in | Wt 187.6 lb

## 2024-03-31 DIAGNOSIS — E782 Mixed hyperlipidemia: Secondary | ICD-10-CM | POA: Diagnosis not present

## 2024-03-31 DIAGNOSIS — Z7984 Long term (current) use of oral hypoglycemic drugs: Secondary | ICD-10-CM | POA: Diagnosis not present

## 2024-03-31 DIAGNOSIS — Z79899 Other long term (current) drug therapy: Secondary | ICD-10-CM | POA: Diagnosis not present

## 2024-03-31 DIAGNOSIS — I10 Essential (primary) hypertension: Secondary | ICD-10-CM | POA: Diagnosis not present

## 2024-03-31 DIAGNOSIS — E6609 Other obesity due to excess calories: Secondary | ICD-10-CM

## 2024-03-31 DIAGNOSIS — Z6833 Body mass index (BMI) 33.0-33.9, adult: Secondary | ICD-10-CM | POA: Insufficient documentation

## 2024-03-31 DIAGNOSIS — E559 Vitamin D deficiency, unspecified: Secondary | ICD-10-CM | POA: Diagnosis not present

## 2024-03-31 DIAGNOSIS — M79605 Pain in left leg: Secondary | ICD-10-CM | POA: Diagnosis not present

## 2024-03-31 DIAGNOSIS — E66811 Obesity, class 1: Secondary | ICD-10-CM

## 2024-03-31 DIAGNOSIS — E1165 Type 2 diabetes mellitus with hyperglycemia: Secondary | ICD-10-CM | POA: Diagnosis not present

## 2024-03-31 DIAGNOSIS — L723 Sebaceous cyst: Secondary | ICD-10-CM | POA: Insufficient documentation

## 2024-03-31 MED ORDER — VALSARTAN 80 MG PO TABS: 80.0000 mg | ORAL_TABLET | Freq: Every day | ORAL | 1 refills | Status: DC

## 2024-03-31 NOTE — Assessment & Plan Note (Signed)
 Not well-controlled, discussed taking HCTZ and valsartan  together at the same time in the mornings Continue to monitor blood pressures at home Continue efforts and low-salt diet Continue efforts and exercise

## 2024-03-31 NOTE — Patient Instructions (Signed)
 Refilled valsartan  today, please take this in the morning with your hydrochlorothiazide   Continue current medication regimen for now.  Follow up with me in about 3 months for labs and medication management, sooner if needed.

## 2024-03-31 NOTE — Assessment & Plan Note (Signed)
 Finish out prescription strength vitamin D , and then go to vitamin D  2000 units once daily from over-the-counter.

## 2024-03-31 NOTE — Assessment & Plan Note (Signed)
 Continue current medication regimen, no dose adjustments needed today Labs reviewed with patient today

## 2024-03-31 NOTE — Assessment & Plan Note (Signed)
 Controlled, continue current regimen, continue efforts and low-fat diet

## 2024-03-31 NOTE — Assessment & Plan Note (Signed)
 Will consider referral if cyst returns

## 2024-03-31 NOTE — Assessment & Plan Note (Signed)
 IT band exercises given

## 2024-03-31 NOTE — Assessment & Plan Note (Signed)
 Controlled, A1c 6.0 Continue metformin  regimen Continue efforts in low sweets, low-carb diet and healthy activity level

## 2024-03-31 NOTE — Assessment & Plan Note (Signed)
 Continue efforts in healthy diet and activity level

## 2024-06-28 ENCOUNTER — Other Ambulatory Visit: Payer: Self-pay | Admitting: Family Medicine

## 2024-06-28 DIAGNOSIS — E559 Vitamin D deficiency, unspecified: Secondary | ICD-10-CM

## 2024-07-04 ENCOUNTER — Encounter: Payer: Self-pay | Admitting: Family Medicine

## 2024-07-04 ENCOUNTER — Ambulatory Visit: Admitting: Family Medicine

## 2024-07-04 VITALS — BP 136/88 | HR 74 | Temp 98.3°F | Ht 63.0 in | Wt 195.4 lb

## 2024-07-04 DIAGNOSIS — I10 Essential (primary) hypertension: Secondary | ICD-10-CM | POA: Diagnosis not present

## 2024-07-04 DIAGNOSIS — Z79899 Other long term (current) drug therapy: Secondary | ICD-10-CM

## 2024-07-04 DIAGNOSIS — E782 Mixed hyperlipidemia: Secondary | ICD-10-CM

## 2024-07-04 DIAGNOSIS — E1165 Type 2 diabetes mellitus with hyperglycemia: Secondary | ICD-10-CM

## 2024-07-04 DIAGNOSIS — Z23 Encounter for immunization: Secondary | ICD-10-CM

## 2024-07-04 DIAGNOSIS — E559 Vitamin D deficiency, unspecified: Secondary | ICD-10-CM

## 2024-07-04 DIAGNOSIS — Z7984 Long term (current) use of oral hypoglycemic drugs: Secondary | ICD-10-CM

## 2024-07-04 LAB — COMPREHENSIVE METABOLIC PANEL WITH GFR
ALT: 14 U/L (ref 0–35)
AST: 14 U/L (ref 0–37)
Albumin: 4.5 g/dL (ref 3.5–5.2)
Alkaline Phosphatase: 66 U/L (ref 39–117)
BUN: 13 mg/dL (ref 6–23)
CO2: 32 meq/L (ref 19–32)
Calcium: 10.1 mg/dL (ref 8.4–10.5)
Chloride: 101 meq/L (ref 96–112)
Creatinine, Ser: 0.61 mg/dL (ref 0.40–1.20)
GFR: 100.05 mL/min (ref >60.00)
Glucose, Bld: 89 mg/dL (ref 70–99)
Potassium: 3.3 meq/L — ABNORMAL LOW (ref 3.5–5.1)
Sodium: 141 meq/L (ref 135–145)
Total Bilirubin: 0.5 mg/dL (ref 0.2–1.2)
Total Protein: 7.7 g/dL (ref 6.0–8.3)

## 2024-07-04 LAB — LIPID PANEL
Cholesterol: 174 mg/dL (ref 0–200)
HDL: 48.5 mg/dL (ref >39.00)
LDL Cholesterol: 109 mg/dL — ABNORMAL HIGH (ref 0–99)
NonHDL: 125.87
Total CHOL/HDL Ratio: 4
Triglycerides: 82 mg/dL (ref 0.0–149.0)
VLDL: 16.4 mg/dL (ref 0.0–40.0)

## 2024-07-04 LAB — TSH: TSH: 1.76 u[IU]/mL (ref 0.35–5.50)

## 2024-07-04 LAB — HEMOGLOBIN A1C: Hgb A1c MFr Bld: 6.5 % (ref 4.6–6.5)

## 2024-07-04 MED ORDER — VALSARTAN 80 MG PO TABS: 80.0000 mg | ORAL_TABLET | Freq: Every day | ORAL | 1 refills | Status: AC

## 2024-07-04 MED ORDER — HYDROCHLOROTHIAZIDE 25 MG PO TABS: 25.0000 mg | ORAL_TABLET | Freq: Every day | ORAL | 3 refills | Status: AC

## 2024-07-04 MED ORDER — VITAMIN D (ERGOCALCIFEROL) 1.25 MG (50000 UNIT) PO CAPS: 50000.0000 [IU] | ORAL_CAPSULE | ORAL | 1 refills | Status: DC

## 2024-07-04 MED ORDER — VALACYCLOVIR HCL 500 MG PO TABS: 500.0000 mg | ORAL_TABLET | Freq: Two times a day (BID) | ORAL | 3 refills | Status: AC

## 2024-07-04 MED ORDER — METFORMIN HCL 500 MG PO TABS: 500.0000 mg | ORAL_TABLET | Freq: Two times a day (BID) | ORAL | 1 refills | Status: AC

## 2024-07-04 NOTE — Progress Notes (Signed)
 Established Patient Office Visit  Subjective:     Patient ID: Jessica Henry, female    DOB: [birth year], 58 y.o.   MRN: [id number]  Chief Complaint  Patient presents with   Follow-up    HPI  Discussed the use of AI scribe software for clinical note transcription with the patient, who gave verbal consent to proceed.  History of Present Illness Jessica Henry is a 58 year old female with hypertension who presents for blood pressure management.  Blood pressure fluctuations - Fluctuating blood pressure with higher readings at home, especially in the evening after work - Current clinic blood pressure is 121/79 mmHg - Home blood pressure readings typically in the 120s over high 70s or 80s - Monitors blood pressure in both arms - Plans to recheck blood pressure before leaving the clinic - No headaches or other concerning symptoms  Medication management issues - Takes metformin , hydrochlorothiazide , and valsartan  - Receives 30-day supplies of medications instead of 90-day supplies despite orders - In contact with pharmacy and insurance regarding supply issues - Prefers not to use mail delivery for medications      ROS Per HPI      Objective:    BP 136/88 (BP Location: Right Arm, Patient Position: Sitting, Cuff Size: Large)   Pulse 74   Temp 98.3 F (36.8 C) (Temporal)   Ht 5' 3 (1.6 m)   Wt 195 lb 6.4 oz (88.6 kg)   SpO2 99%   BMI 34.61 kg/m    Physical Exam Vitals and nursing note reviewed.  Constitutional:      General: She is not in acute distress.    Appearance: Normal appearance.  HENT:     Head: Normocephalic and atraumatic.     Right Ear: External ear normal.     Left Ear: External ear normal.  Eyes:     Extraocular Movements: Extraocular movements intact.     Pupils: Pupils are equal, round, and reactive to light.  Cardiovascular:     Rate and Rhythm: Normal rate and regular rhythm.     Pulses: Normal pulses.     Heart sounds: Normal heart sounds.   Pulmonary:     Effort: Pulmonary effort is normal. No respiratory distress.     Breath sounds: Normal breath sounds. No wheezing, rhonchi or rales.  Musculoskeletal:        General: Normal range of motion.     Cervical back: Normal range of motion.     Right lower leg: No edema.     Left lower leg: No edema.  Lymphadenopathy:     Cervical: No cervical adenopathy.  Neurological:     General: No focal deficit present.     Mental Status: She is alert and oriented to person, place, and time.  Psychiatric:        Mood and Affect: Mood normal.        Thought Content: Thought content normal.     No results found for any visits on 07/04/24.  The 10-year ASCVD risk score (Arnett DK, et al., 2019) is: 12.4%  BP Readings from Last 3 Encounters:  07/04/24 136/88  03/31/24 138/88  01/04/24 (!) 140/88   Wt Readings from Last 3 Encounters:  07/04/24 195 lb 6.4 oz (88.6 kg)  03/31/24 187 lb 9.6 oz (85.1 kg)  12/07/23 188 lb 9.6 oz (85.5 kg)      Last CBC Lab Results  Component Value Date   WBC 8.6 03/07/2024   HGB 11.8 (L)  03/07/2024   HCT 36.3 03/07/2024   MCV 78.6 03/07/2024   MCH 25.4 (L) 08/20/2023   RDW 14.5 03/07/2024   PLT 306.0 03/07/2024   Last metabolic panel Lab Results  Component Value Date   GLUCOSE 85 03/07/2024   NA 140 03/07/2024   K 3.5 03/07/2024   CL 102 03/07/2024   CO2 30 03/07/2024   BUN 12 03/07/2024   CREATININE 0.62 03/07/2024   GFR 99.89 03/07/2024   CALCIUM 9.6 03/07/2024   PROT 7.7 03/07/2024   ALBUMIN 4.5 03/07/2024   BILITOT 0.3 03/07/2024   ALKPHOS 71 03/07/2024   AST 13 03/07/2024   ALT 11 03/07/2024   Last lipids Lab Results  Component Value Date   CHOL 158 03/07/2024   HDL 53.00 03/07/2024   LDLCALC 87 03/07/2024   TRIG 91.0 03/07/2024   CHOLHDL 3 03/07/2024   Last hemoglobin A1c Lab Results  Component Value Date   HGBA1C 6.0 03/07/2024   Last thyroid  functions Lab Results  Component Value Date   TSH 0.68 08/20/2023    Last vitamin D  Lab Results  Component Value Date   VD25OH 33.12 03/07/2024   Last vitamin B12 and Folate No results found for: VITAMINB12, FOLATE       Assessment & Plan:   Assessment and Plan Assessment & Plan Primary hypertension Blood pressure controlled with current regimen. Insurance issues affecting medication supply. - Continue current antihypertensive regimen. - Recheck blood pressure before end of visit. - Investigate insurance options for 90-day medication supply. - Refill Valsartan  80 mg oral daily.  Type 2 diabetes mellitus with hyperglycemia, without long term use of insulin A1c controlled, indicating effective management. - Continue Metformin  as prescribed. - Refill Metformin  oral.  Mixed Hyperlipidemia - lipids today  Vitamin D  deficiency Vitamin D  levels insufficient despite supplementation. - Refill vitamin D  supplementation.  Immunization Due - Flu vaccine given today     Orders Placed This Encounter  Procedures   Flu vaccine trivalent PF, 6mos and older(Flulaval,Afluria,Fluarix,Fluzone)   Comprehensive metabolic panel with GFR    Release to patient:   Immediate [1]   Hemoglobin A1c   TSH   Lipid Profile     Meds ordered this encounter  Medications   metFORMIN  (GLUCOPHAGE ) 500 MG tablet    Sig: Take 1 tablet (500 mg total) by mouth 2 (two) times daily with a meal.    Dispense:  180 tablet    Refill:  1   valsartan  (DIOVAN ) 80 MG tablet    Sig: Take 1 tablet (80 mg total) by mouth daily.    Dispense:  90 tablet    Refill:  1   hydrochlorothiazide  (HYDRODIURIL ) 25 MG tablet    Sig: Take 1 tablet (25 mg total) by mouth daily.    Dispense:  90 tablet    Refill:  3   Vitamin D , Ergocalciferol , (DRISDOL ) 1.25 MG (50000 UNIT) CAPS capsule    Sig: Take 1 capsule (50,000 Units total) by mouth every 7 (seven) days.    Dispense:  4 capsule    Refill:  1   valACYclovir  (VALTREX ) 500 MG tablet    Sig: Take 1 tablet (500 mg total) by  mouth 2 (two) times daily.    Dispense:  14 tablet    Refill:  3    Return in about 6 months (around 01/01/2025) for F/u.  Corean LITTIE Ku, FNP

## 2024-07-04 NOTE — Patient Instructions (Addendum)
 Continue current medication regimen.   We have given your flu vaccine today.   We are checking labs today, will be in contact with any results that require further attention.  Follow-up with me in 6 mos for medication management, sooner if needed.

## 2024-07-05 ENCOUNTER — Ambulatory Visit: Payer: Self-pay | Admitting: Family Medicine

## 2024-07-07 ENCOUNTER — Other Ambulatory Visit: Payer: Self-pay

## 2024-07-07 DIAGNOSIS — E782 Mixed hyperlipidemia: Secondary | ICD-10-CM

## 2024-07-07 MED ORDER — ROSUVASTATIN CALCIUM 5 MG PO TABS: 5.0000 mg | ORAL_TABLET | Freq: Every day | ORAL | 1 refills | Status: DC

## 2024-07-29 ENCOUNTER — Other Ambulatory Visit: Payer: Self-pay | Admitting: Family Medicine

## 2024-07-29 DIAGNOSIS — E559 Vitamin D deficiency, unspecified: Secondary | ICD-10-CM

## 2024-07-31 ENCOUNTER — Other Ambulatory Visit: Payer: Self-pay | Admitting: Family Medicine

## 2024-07-31 DIAGNOSIS — E782 Mixed hyperlipidemia: Secondary | ICD-10-CM

## 2024-08-18 ENCOUNTER — Telehealth: Payer: Self-pay

## 2024-08-18 NOTE — Telephone Encounter (Signed)
 Noted

## 2024-08-18 NOTE — Telephone Encounter (Signed)
 Copied from CRM [id number]. Topic: General - Other >> Aug 18, 2024  1:51 PM Aleatha C wrote: Reason for CRM: Patient will be bringing in a work form for Nurse Alvia to sign she had previously brought in it, but there wasn't any note of it, the form is pink and it has to do with patient job

## 2024-08-29 ENCOUNTER — Other Ambulatory Visit: Payer: Self-pay | Admitting: Family Medicine

## 2024-08-29 DIAGNOSIS — E559 Vitamin D deficiency, unspecified: Secondary | ICD-10-CM

## 2024-10-31 ENCOUNTER — Other Ambulatory Visit: Payer: Self-pay | Admitting: Family Medicine

## 2024-10-31 DIAGNOSIS — E559 Vitamin D deficiency, unspecified: Secondary | ICD-10-CM

## 2024-11-13 ENCOUNTER — Other Ambulatory Visit: Payer: Self-pay | Admitting: Obstetrics and Gynecology

## 2024-11-13 DIAGNOSIS — Z1231 Encounter for screening mammogram for malignant neoplasm of breast: Secondary | ICD-10-CM

## 2024-11-21 ENCOUNTER — Ambulatory Visit
